# Patient Record
Sex: Female | Born: 1950 | Race: White | Hispanic: No | Smoking: Never smoker
Health system: Southern US, Community
[De-identification: ages and names within clinical notes are randomized; demographics above are authoritative.]

## PROBLEM LIST (undated history)

## (undated) DIAGNOSIS — K439 Ventral hernia without obstruction or gangrene: Secondary | ICD-10-CM

## (undated) DIAGNOSIS — D472 Monoclonal gammopathy: Secondary | ICD-10-CM

## (undated) DIAGNOSIS — C801 Malignant (primary) neoplasm, unspecified: Secondary | ICD-10-CM

## (undated) HISTORY — DX: Malignant (primary) neoplasm, unspecified: C80.1

## (undated) HISTORY — PX: HERNIA REPAIR: SHX51

## (undated) HISTORY — PX: COLONOSCOPY: SHX174

## (undated) HISTORY — DX: Monoclonal gammopathy: D47.2

## (undated) HISTORY — PX: COLECTOMY: SHX59

## (undated) HISTORY — PX: OTHER SURGICAL HISTORY: SHX169

---
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
## 1998-06-23 ENCOUNTER — Other Ambulatory Visit: Admission: RE | Admit: 1998-06-23 | Discharge: 1998-06-23 | Payer: Self-pay | Admitting: Gynecology

## 1999-07-06 ENCOUNTER — Other Ambulatory Visit: Admission: RE | Admit: 1999-07-06 | Discharge: 1999-07-06 | Payer: Self-pay | Admitting: Obstetrics and Gynecology

## 1999-09-05 ENCOUNTER — Encounter: Payer: Self-pay | Admitting: Obstetrics and Gynecology

## 1999-09-05 ENCOUNTER — Encounter: Admission: RE | Admit: 1999-09-05 | Discharge: 1999-09-05 | Payer: Self-pay | Admitting: Obstetrics and Gynecology

## 2000-07-24 ENCOUNTER — Other Ambulatory Visit: Admission: RE | Admit: 2000-07-24 | Discharge: 2000-07-24 | Payer: Self-pay | Admitting: Obstetrics and Gynecology

## 2000-09-05 ENCOUNTER — Encounter: Admission: RE | Admit: 2000-09-05 | Discharge: 2000-09-05 | Payer: Self-pay | Admitting: Obstetrics and Gynecology

## 2000-09-05 ENCOUNTER — Encounter: Payer: Self-pay | Admitting: Obstetrics and Gynecology

## 2001-09-01 ENCOUNTER — Other Ambulatory Visit: Admission: RE | Admit: 2001-09-01 | Discharge: 2001-09-01 | Payer: Self-pay | Admitting: Obstetrics and Gynecology

## 2001-09-05 ENCOUNTER — Encounter: Payer: Self-pay | Admitting: Obstetrics and Gynecology

## 2001-09-05 ENCOUNTER — Encounter: Admission: RE | Admit: 2001-09-05 | Discharge: 2001-09-05 | Payer: Self-pay | Admitting: Obstetrics and Gynecology

## 2002-09-07 ENCOUNTER — Encounter: Payer: Self-pay | Admitting: Obstetrics and Gynecology

## 2002-09-07 ENCOUNTER — Encounter: Admission: RE | Admit: 2002-09-07 | Discharge: 2002-09-07 | Payer: Self-pay | Admitting: Obstetrics and Gynecology

## 2002-09-16 ENCOUNTER — Other Ambulatory Visit: Admission: RE | Admit: 2002-09-16 | Discharge: 2002-09-16 | Payer: Self-pay | Admitting: Obstetrics and Gynecology

## 2003-09-21 ENCOUNTER — Encounter: Admission: RE | Admit: 2003-09-21 | Discharge: 2003-09-21 | Payer: Self-pay | Admitting: Obstetrics and Gynecology

## 2003-12-15 ENCOUNTER — Other Ambulatory Visit: Admission: RE | Admit: 2003-12-15 | Discharge: 2003-12-15 | Payer: Self-pay | Admitting: Obstetrics and Gynecology

## 2004-09-21 ENCOUNTER — Encounter: Admission: RE | Admit: 2004-09-21 | Discharge: 2004-09-21 | Payer: Self-pay | Admitting: Obstetrics and Gynecology

## 2005-01-24 ENCOUNTER — Other Ambulatory Visit: Admission: RE | Admit: 2005-01-24 | Discharge: 2005-01-24 | Payer: Self-pay | Admitting: Obstetrics and Gynecology

## 2005-06-11 ENCOUNTER — Ambulatory Visit (HOSPITAL_COMMUNITY): Admission: RE | Admit: 2005-06-11 | Discharge: 2005-06-11 | Payer: Self-pay | Admitting: Neurosurgery

## 2005-11-06 ENCOUNTER — Encounter: Admission: RE | Admit: 2005-11-06 | Discharge: 2005-11-06 | Payer: Self-pay | Admitting: Obstetrics and Gynecology

## 2005-11-21 ENCOUNTER — Encounter: Admission: RE | Admit: 2005-11-21 | Discharge: 2006-02-19 | Payer: Self-pay | Admitting: Specialist

## 2006-07-31 ENCOUNTER — Other Ambulatory Visit: Admission: RE | Admit: 2006-07-31 | Discharge: 2006-07-31 | Payer: Self-pay | Admitting: Obstetrics and Gynecology

## 2006-12-03 ENCOUNTER — Encounter: Admission: RE | Admit: 2006-12-03 | Discharge: 2006-12-03 | Payer: Self-pay | Admitting: Obstetrics and Gynecology

## 2007-12-08 ENCOUNTER — Encounter: Admission: RE | Admit: 2007-12-08 | Discharge: 2007-12-08 | Payer: Self-pay | Admitting: Obstetrics and Gynecology

## 2008-12-14 ENCOUNTER — Encounter: Admission: RE | Admit: 2008-12-14 | Discharge: 2008-12-14 | Payer: Self-pay | Admitting: Obstetrics and Gynecology

## 2009-07-15 ENCOUNTER — Encounter: Admission: RE | Admit: 2009-07-15 | Discharge: 2009-07-15 | Payer: Self-pay | Admitting: Internal Medicine

## 2009-08-12 ENCOUNTER — Encounter: Admission: RE | Admit: 2009-08-12 | Discharge: 2009-08-12 | Payer: Self-pay | Admitting: Neurological Surgery

## 2009-11-10 ENCOUNTER — Inpatient Hospital Stay (HOSPITAL_COMMUNITY): Admission: RE | Admit: 2009-11-10 | Discharge: 2009-11-15 | Payer: Self-pay | Admitting: General Surgery

## 2009-11-10 ENCOUNTER — Encounter (INDEPENDENT_AMBULATORY_CARE_PROVIDER_SITE_OTHER): Payer: Self-pay | Admitting: General Surgery

## 2009-12-19 ENCOUNTER — Encounter: Admission: RE | Admit: 2009-12-19 | Discharge: 2009-12-19 | Payer: Self-pay | Admitting: Obstetrics and Gynecology

## 2010-04-07 ENCOUNTER — Encounter (HOSPITAL_COMMUNITY): Admission: RE | Admit: 2010-04-07 | Discharge: 2010-05-07 | Payer: Self-pay | Admitting: Oncology

## 2010-04-10 ENCOUNTER — Ambulatory Visit (HOSPITAL_COMMUNITY): Payer: Self-pay | Admitting: Oncology

## 2010-05-19 ENCOUNTER — Encounter (HOSPITAL_COMMUNITY): Admission: RE | Admit: 2010-05-19 | Discharge: 2010-06-18 | Payer: Self-pay | Admitting: Oncology

## 2010-06-13 ENCOUNTER — Ambulatory Visit (HOSPITAL_COMMUNITY): Payer: Self-pay | Admitting: Oncology

## 2010-10-03 ENCOUNTER — Encounter (HOSPITAL_COMMUNITY)
Admission: RE | Admit: 2010-10-03 | Discharge: 2010-11-02 | Payer: Self-pay | Source: Home / Self Care | Attending: Oncology | Admitting: Oncology

## 2010-10-03 ENCOUNTER — Ambulatory Visit (HOSPITAL_COMMUNITY): Payer: Self-pay | Admitting: Oncology

## 2010-11-25 ENCOUNTER — Other Ambulatory Visit: Payer: Self-pay | Admitting: Obstetrics and Gynecology

## 2010-11-25 DIAGNOSIS — Z1239 Encounter for other screening for malignant neoplasm of breast: Secondary | ICD-10-CM

## 2011-01-01 ENCOUNTER — Ambulatory Visit
Admission: RE | Admit: 2011-01-01 | Discharge: 2011-01-01 | Disposition: A | Discharge status: Home / Self Care | Payer: BC Managed Care – PPO | Source: Ambulatory Visit | Attending: Obstetrics and Gynecology | Admitting: Obstetrics and Gynecology

## 2011-01-01 DIAGNOSIS — Z1239 Encounter for other screening for malignant neoplasm of breast: Secondary | ICD-10-CM

## 2011-01-02 ENCOUNTER — Encounter (HOSPITAL_COMMUNITY): Payer: BC Managed Care – PPO | Attending: Oncology

## 2011-01-02 ENCOUNTER — Other Ambulatory Visit (HOSPITAL_COMMUNITY): Payer: BC Managed Care – PPO

## 2011-01-02 DIAGNOSIS — D472 Monoclonal gammopathy: Secondary | ICD-10-CM

## 2011-01-02 DIAGNOSIS — C8800 Waldenstrom macroglobulinemia not having achieved remission: Secondary | ICD-10-CM | POA: Insufficient documentation

## 2011-01-12 ENCOUNTER — Ambulatory Visit (HOSPITAL_COMMUNITY): Payer: BC Managed Care – PPO | Admitting: Oncology

## 2011-01-12 DIAGNOSIS — E8809 Other disorders of plasma-protein metabolism, not elsewhere classified: Secondary | ICD-10-CM

## 2011-01-16 LAB — DIFFERENTIAL
Eosinophils Absolute: 0.1 10*3/uL (ref 0.0–0.7)
Eosinophils Relative: 2 % (ref 0–5)
Lymphocytes Relative: 45 % (ref 12–46)
Lymphs Abs: 3.6 10*3/uL (ref 0.7–4.0)
Monocytes Relative: 7 % (ref 3–12)
Neutro Abs: 3.7 10*3/uL (ref 1.7–7.7)

## 2011-01-16 LAB — IMMUNOFIXATION ELECTROPHORESIS: IgG (Immunoglobin G), Serum: 1060 mg/dL (ref 694–1618)

## 2011-01-16 LAB — PROTEIN ELECTROPHORESIS, SERUM
Albumin ELP: 45.7 % — ABNORMAL LOW (ref 55.8–66.1)
Alpha-2-Globulin: 8.6 % (ref 7.1–11.8)
Beta 2: 4.4 % (ref 3.2–6.5)
Beta Globulin: 3.8 % — ABNORMAL LOW (ref 4.7–7.2)

## 2011-01-16 LAB — COMPREHENSIVE METABOLIC PANEL
ALT: 16 U/L (ref 0–35)
Albumin: 3.9 g/dL (ref 3.5–5.2)
BUN: 17 mg/dL (ref 6–23)
Creatinine, Ser: 0.93 mg/dL (ref 0.4–1.2)
Total Protein: 9.3 g/dL — ABNORMAL HIGH (ref 6.0–8.3)

## 2011-01-16 LAB — KAPPA/LAMBDA LIGHT CHAINS: Lambda free light chains: 63.7 mg/dL — ABNORMAL HIGH (ref 0.57–2.63)

## 2011-01-16 LAB — CBC
Hemoglobin: 11.8 g/dL — ABNORMAL LOW (ref 12.0–15.0)
MCHC: 33.6 g/dL (ref 30.0–36.0)
RDW: 13.4 % (ref 11.5–15.5)

## 2011-01-19 LAB — CREATININE, SERUM: Creatinine, Ser: 0.74 mg/dL (ref 0.4–1.2)

## 2011-01-20 LAB — DIFFERENTIAL
Eosinophils Absolute: 0.1 10*3/uL (ref 0.0–0.7)
Lymphs Abs: 2.7 10*3/uL (ref 0.7–4.0)
Neutro Abs: 2.9 10*3/uL (ref 1.7–7.7)
Neutrophils Relative %: 46 % (ref 43–77)

## 2011-01-20 LAB — BONE MARROW EXAM

## 2011-01-20 LAB — TISSUE HYBRIDIZATION (BONE MARROW)-NCBH

## 2011-01-20 LAB — CBC
MCH: 31.2 pg (ref 26.0–34.0)
Platelets: 230 10*3/uL (ref 150–400)
RBC: 3.7 MIL/uL — ABNORMAL LOW (ref 3.87–5.11)
WBC: 6.4 10*3/uL (ref 4.0–10.5)

## 2011-01-20 LAB — CHROMOSOME ANALYSIS, BONE MARROW

## 2011-01-21 LAB — CBC
HCT: 28.3 % — ABNORMAL LOW (ref 36.0–46.0)
Hemoglobin: 11.7 g/dL — ABNORMAL LOW (ref 12.0–15.0)
Hemoglobin: 9.7 g/dL — ABNORMAL LOW (ref 12.0–15.0)
MCHC: 34.2 g/dL (ref 30.0–36.0)
MCV: 92.4 fL (ref 78.0–100.0)
RBC: 3.73 MIL/uL — ABNORMAL LOW (ref 3.87–5.11)
RDW: 13.2 % (ref 11.5–15.5)
WBC: 7.7 10*3/uL (ref 4.0–10.5)

## 2011-01-21 LAB — BASIC METABOLIC PANEL
CO2: 28 mEq/L (ref 19–32)
GFR calc non Af Amer: >60 mL/min (ref >60)
Glucose, Bld: 143 mg/dL — ABNORMAL HIGH (ref 70–99)
Potassium: 3.8 mEq/L (ref 3.5–5.1)
Sodium: 135 mEq/L (ref 135–145)

## 2011-01-21 LAB — COMPREHENSIVE METABOLIC PANEL
ALT: 65 U/L — ABNORMAL HIGH (ref 0–35)
AST: 30 U/L (ref 0–37)
CO2: 31 mEq/L (ref 19–32)
Chloride: 102 mEq/L (ref 96–112)
Creatinine, Ser: 0.7 mg/dL (ref 0.4–1.2)
GFR calc Af Amer: >60 mL/min (ref >60)
GFR calc non Af Amer: >60 mL/min (ref >60)
Sodium: 139 mEq/L (ref 135–145)
Total Bilirubin: 0.6 mg/dL (ref 0.3–1.2)

## 2011-01-21 LAB — DIFFERENTIAL
Basophils Absolute: 0 10*3/uL (ref 0.0–0.1)
Basophils Relative: 1 % (ref 0–1)
Eosinophils Absolute: 0.1 10*3/uL (ref 0.0–0.7)
Eosinophils Relative: 2 % (ref 0–5)

## 2011-01-21 LAB — PROTIME-INR: Prothrombin Time: 12.9 seconds (ref 11.6–15.2)

## 2011-01-22 LAB — COMPREHENSIVE METABOLIC PANEL
Alkaline Phosphatase: 83 U/L (ref 39–117)
BUN: 16 mg/dL (ref 6–23)
CO2: 29 mEq/L (ref 19–32)
Chloride: 103 mEq/L (ref 96–112)
Creatinine, Ser: 0.82 mg/dL (ref 0.4–1.2)
GFR calc non Af Amer: >60 mL/min (ref >60)
Glucose, Bld: 78 mg/dL (ref 70–99)
Total Bilirubin: 0.5 mg/dL (ref 0.3–1.2)

## 2011-01-22 LAB — HEPATITIS PANEL, ACUTE
HCV Ab: NEGATIVE
Hep A IgM: NEGATIVE

## 2011-01-22 LAB — IRON AND TIBC
Iron: 54 ug/dL (ref 42–135)
TIBC: 244 ug/dL — ABNORMAL LOW (ref 250–470)

## 2011-01-22 LAB — PROTEIN, URINE, 24 HOUR
Protein, Urine: <6 mg/dL
Urine Total Volume-UPROT: 1550 mL

## 2011-01-22 LAB — STREP A DNA PROBE: Group A Strep Probe: NEGATIVE

## 2011-01-22 LAB — CBC
HCT: 33.6 % — ABNORMAL LOW (ref 36.0–46.0)
Hemoglobin: 11.3 g/dL — ABNORMAL LOW (ref 12.0–15.0)
MCV: 92.2 fL (ref 78.0–100.0)
Platelets: 232 10*3/uL (ref 150–400)
RBC: 3.65 MIL/uL — ABNORMAL LOW (ref 3.87–5.11)
WBC: 8 10*3/uL (ref 4.0–10.5)

## 2011-01-22 LAB — CREATININE, SERUM: Creatinine, Ser: 0.8 mg/dL (ref 0.4–1.2)

## 2011-01-22 LAB — KAPPA/LAMBDA LIGHT CHAINS
Kappa, lambda light chain ratio: 0.02 — ABNORMAL LOW (ref 0.26–1.65)
Lambda free light chains: 34.4 mg/dL — ABNORMAL HIGH (ref 0.57–2.63)

## 2011-01-22 LAB — DIFFERENTIAL
Basophils Absolute: 0 10*3/uL (ref 0.0–0.1)
Basophils Relative: 1 % (ref 0–1)
Lymphocytes Relative: 42 % (ref 12–46)
Neutro Abs: 3.8 10*3/uL (ref 1.7–7.7)
Neutrophils Relative %: 48 % (ref 43–77)

## 2011-01-22 LAB — CREATININE CLEARANCE, URINE, 24 HOUR
Collection Interval-CRCL: 24 hours
Creatinine, 24H Ur: 1191 mg/d (ref 700–1800)
Urine Total Volume-CRCL: 1550 mL

## 2011-01-22 LAB — RETICULOCYTES
RBC.: 3.65 MIL/uL — ABNORMAL LOW (ref 3.87–5.11)
Retic Ct Pct: 1.4 % (ref 0.4–3.1)

## 2011-01-22 LAB — IMMUNOFIXATION ELECTROPHORESIS: IgA: 96 mg/dL (ref 68–378)

## 2011-03-23 NOTE — H&P (Signed)
[name], [name]          ACCOUNT NO.:  [id number]   MEDICAL RECORD NO.:  [id number]          PATIENT TYPE:  OIB   LOCATION:  2854                         FACILITY:  [facility]   PHYSICIAN:  Sanjeev K. Deveshwar, M.D.DATE OF BIRTH:  Jun 25, 1951   DATE OF ADMISSION:  06/11/2005  DATE OF DISCHARGE:                                HISTORY & PHYSICAL   CHIEF COMPLAINT:  The patient is here for a cerebral angiogram.   HISTORY OF PRESENT ILLNESS:  This is a 60 year old female with a history of  migraine headaches.  She was recently evaluated by Dr. Santiago Glad for  a severe headache on May 11, 2005.  MRI/MRA was ordered and performed on  May 23, 2005.  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  The patient was referred to Dr. Franky Macho for further  evaluation.  Dr. Franky Macho recommended a cerebral angiogram to rule out a  possible aneurysm as seen on the MRA.  The patient reports that she has been  doing fine since that time except she has had a nonstop headache.   PAST MEDICAL HISTORY:  Essentially negative except for a history of migraine  headaches.   PAST SURGICAL HISTORY:  The patient is status post appendectomy in 1969.   ALLERGIES:  No known drug allergies.  She has never had contrast.   CURRENT MEDICATIONS:  1.  Lexapro 10 mg daily.  2.  Topamax 50 mg daily.  3.  Tramadol p.r.n.   SOCIAL HISTORY:  The patient is widowed.  She has no children.  She lives in  [place] alone.  She does not use tobacco.  She uses alcohol socially.  She works in Barrister's clerk at [employer].   FAMILY HISTORY:  The patient's mother died at age 19 from myasthenia gravis.  Her father died at age 11 from a brain tumor.   REVIEW OF SYSTEMS:  Completely negative except for the above noted  headaches.   PHYSICAL EXAMINATION:  GENERAL:  Pleasant 60 year old white female in no  acute distress.  VITAL SIGNS:  Blood pressure 107/68, pulse 78, respirations 16,  temperature  97.3.  HEENT:  Unremarkable.  NECK:  No bruits.  No jugular venous distention.  HEART:  Regular rate and rhythm without murmur.  LUNGS:  Clear.  ABDOMEN:  Soft, nontender.  EXTREMITIES:  Weak, but intact.  SKIN:  Warm and dry.  NEUROLOGIC:  Mental status:  The patient was alert and oriented and follows  commands.  Cranial nerves II-XII are grossly intact.  Sensation is intact to  light touch.  Motor strength is 5/5 throughout.  Cerebellar testing is  intact.  Her airway was rated at a 2.  Her ASA scale was rated at a 1.   IMPRESSION:  1.  Migraine headaches.  2.  Recent severe headache on July 7.  3.  MRA May 23, 2005 questioning a 2-3 mm right middle cerebral artery      aneurysm.   PLAN:  The patient will undergo a cerebral angiogram today to further  evaluate for a possible aneurysm.  Risks and benefits were  discussed in  depth with the patient and she agrees to proceed.   ADMITTING DIAGNOSES:  Laboratory data:  An INR was 1, PTT was 33.  CBC  revealed hemoglobin 12.3, hematocrit 35.9, platelets 285,000.  BUN was 18,  creatinine 0.8, potassium 3.6, glucose 93.      Markus.Osmond   DR/MEDQ  D:  06/11/2005  T:  06/11/2005  Job:  [id number]   cc:   Theressa Millard, M.D.  [address] [place]  [place] [address]  Fax: [phone]   Santiago Glad  [address]  [place]  [place] [address]  Fax: [phone]

## 2011-04-17 ENCOUNTER — Encounter (HOSPITAL_COMMUNITY): Payer: Self-pay | Admitting: *Deleted

## 2011-04-23 ENCOUNTER — Encounter (HOSPITAL_COMMUNITY): Payer: Self-pay | Admitting: Oncology

## 2011-04-23 ENCOUNTER — Other Ambulatory Visit (HOSPITAL_COMMUNITY): Payer: Self-pay | Admitting: Oncology

## 2011-04-23 DIAGNOSIS — D472 Monoclonal gammopathy: Secondary | ICD-10-CM

## 2011-04-23 HISTORY — DX: Monoclonal gammopathy: D47.2

## 2011-05-14 ENCOUNTER — Encounter (HOSPITAL_COMMUNITY): Payer: Self-pay

## 2011-05-15 ENCOUNTER — Other Ambulatory Visit (HOSPITAL_COMMUNITY): Payer: Self-pay | Admitting: Oncology

## 2011-05-15 ENCOUNTER — Encounter (HOSPITAL_COMMUNITY): Payer: BC Managed Care – PPO | Attending: Oncology | Admitting: Oncology

## 2011-05-15 ENCOUNTER — Ambulatory Visit (HOSPITAL_COMMUNITY)
Admission: RE | Admit: 2011-05-15 | Discharge: 2011-05-15 | Disposition: A | Discharge status: Home / Self Care | Payer: BC Managed Care – PPO | Source: Ambulatory Visit | Attending: Oncology | Admitting: Oncology

## 2011-05-15 VITALS — BP 94/61 | HR 54 | Temp 97.6°F

## 2011-05-15 DIAGNOSIS — C8800 Waldenstrom macroglobulinemia not having achieved remission: Secondary | ICD-10-CM | POA: Insufficient documentation

## 2011-05-15 DIAGNOSIS — C9000 Multiple myeloma not having achieved remission: Secondary | ICD-10-CM

## 2011-05-15 DIAGNOSIS — D472 Monoclonal gammopathy: Secondary | ICD-10-CM | POA: Insufficient documentation

## 2011-05-15 LAB — COMPREHENSIVE METABOLIC PANEL
ALT: 8 U/L (ref 0–35)
Alkaline Phosphatase: 73 U/L (ref 39–117)
CO2: 28 mEq/L (ref 19–32)
Calcium: 9.5 mg/dL (ref 8.4–10.5)
Chloride: 105 mEq/L (ref 96–112)
GFR calc Af Amer: >60 mL/min (ref >60)
GFR calc non Af Amer: >60 mL/min (ref >60)
Glucose, Bld: 98 mg/dL (ref 70–99)
Potassium: 4.2 mEq/L (ref 3.5–5.1)
Sodium: 140 mEq/L (ref 135–145)
Total Bilirubin: 0.4 mg/dL (ref 0.3–1.2)

## 2011-05-15 LAB — CBC
Hemoglobin: 10.7 g/dL — ABNORMAL LOW (ref 12.0–15.0)
MCH: 31 pg (ref 26.0–34.0)
MCV: 93.3 fL (ref 78.0–100.0)
RBC: 3.45 MIL/uL — ABNORMAL LOW (ref 3.87–5.11)

## 2011-05-15 LAB — DIFFERENTIAL
Eosinophils Absolute: 0.1 10*3/uL (ref 0.0–0.7)
Lymphs Abs: 2.7 10*3/uL (ref 0.7–4.0)
Monocytes Relative: 8 % (ref 3–12)
Neutrophils Relative %: 45 % (ref 43–77)

## 2011-05-15 LAB — PROTEIN, URINE, 24 HOUR
Protein, 24H Urine: 105 mg/d — ABNORMAL HIGH (ref 50–100)
Protein, Urine: 11 mg/dL
Urine Total Volume-UPROT: 950 mL

## 2011-05-15 NOTE — Progress Notes (Signed)
PROCEDURE:  Bone marrow aspirate and biopsy from the right posterior superior iliac spinous process.  DIAGNOSIS:  Monoclonal gammopathy of undetermined significance versus developing IgM myeloma versus Waldenstrom's macroglobulinemia.  Lindsay Cook was brought to the clinic by her sister-in-law.  She was alert.  She was oriented.  Her vital signs showed her blood pressure to be stable.  Pulse to be right around 60 and regular.  She was warm and dry to the touch.  She was here for a bone marrow aspirate and biopsy and seemed to understand the procedure completely.  She had taken her premeds approximately an hour before arrival.  She was placed in the prone position.  After making sure she did not have any questions about the procedure.  We identified both posterior superior iliac spinous processes and chose the right.  She was then cleansed with 3 Betadine swabs and anesthetized with 9 cc of 2% plain Xylocaine.  She had an aspirate done without difficulty for both routine_H and E  as well as cytogenetics and flow cytometry and then she had a routine bone marrow biopsy done without incident and she was in intact shape when the procedure was finished with no complications.  Labs were then drawn from her peripheral vein and sent for followup evaluation.    ______________________________ [name]. [name], MD ESN/MEDQ  D:  05/15/2011  T:  05/15/2011  Job:  [id number]

## 2011-05-15 NOTE — Progress Notes (Signed)
Patient arrived and verbalized understanding of procedure. Xanax 0.5mg  and Hydrocodone 10/325 taken by pt at 710 am at home prior to arrival. Consent signed. Positioned supine for procedure.Time-out performed.Procedure began at 0847. Xylocaine 2% 10 cc used for local. Finished at [phone]. Tolerated well.Pressure dressing applied with instructions to leave in place for 24 hrs and to report any bleeding that saturates dressing and to take pain med as indicated. Dressing dry and intact to left hip on discharge.

## 2011-05-15 NOTE — Patient Instructions (Addendum)
Today you had a bone marrow biopsy and aspirate to your left hip. Please keep the pressure dressing in place for at least 24hrs. Have someone check your dressing periodically for bleeding.  If needed you can reapply a pressure dressing to the site. If bleeding reoccurs call us [phone] or report to Emergency Dept. Take pain medication as directed.

## 2011-05-18 LAB — MULTIPLE MYELOMA PANEL, SERUM
Alpha-1-Globulin: 3.4 % (ref 2.9–4.9)
Alpha-2-Globulin: 9.2 % (ref 7.1–11.8)
Beta 2: 4.3 % (ref 3.2–6.5)
Beta Globulin: 3.8 % — ABNORMAL LOW (ref 4.7–7.2)
Gamma Globulin: 32 % — ABNORMAL HIGH (ref 11.1–18.8)
IgM, Serum: 2880 mg/dL — ABNORMAL HIGH (ref 52–322)

## 2011-07-06 ENCOUNTER — Other Ambulatory Visit (HOSPITAL_COMMUNITY): Payer: Self-pay | Admitting: Oncology

## 2011-07-06 ENCOUNTER — Encounter (HOSPITAL_COMMUNITY): Payer: BC Managed Care – PPO | Attending: Oncology

## 2011-07-06 DIAGNOSIS — C8800 Waldenstrom macroglobulinemia not having achieved remission: Secondary | ICD-10-CM | POA: Insufficient documentation

## 2011-07-06 DIAGNOSIS — D472 Monoclonal gammopathy: Secondary | ICD-10-CM | POA: Insufficient documentation

## 2011-07-06 LAB — COMPREHENSIVE METABOLIC PANEL
AST: 10 U/L (ref 0–37)
Albumin: 3.6 g/dL (ref 3.5–5.2)
BUN: 17 mg/dL (ref 6–23)
CO2: 29 mEq/L (ref 19–32)
Calcium: 9.6 mg/dL (ref 8.4–10.5)
Creatinine, Ser: 0.64 mg/dL (ref 0.50–1.10)
GFR calc non Af Amer: >60 mL/min (ref >60)
Total Bilirubin: 0.3 mg/dL (ref 0.3–1.2)

## 2011-07-06 LAB — CBC
HCT: 34.2 % — ABNORMAL LOW (ref 36.0–46.0)
MCH: 30.5 pg (ref 26.0–34.0)
MCV: 94.7 fL (ref 78.0–100.0)
Platelets: 187 10*3/uL (ref 150–400)
RDW: 13.6 % (ref 11.5–15.5)

## 2011-07-06 LAB — DIFFERENTIAL
Basophils Relative: 0 % (ref 0–1)
Eosinophils Absolute: 0.1 10*3/uL (ref 0.0–0.7)
Eosinophils Relative: 1 % (ref 0–5)
Lymphs Abs: 2.4 10*3/uL (ref 0.7–4.0)
Monocytes Relative: 8 % (ref 3–12)

## 2011-07-06 LAB — C-REACTIVE PROTEIN: CRP: 0.25 mg/dL — ABNORMAL LOW (ref <0.60)

## 2011-07-06 NOTE — Progress Notes (Signed)
Labs drawn today for cbc/diff,cmp,ldh,b4m,crp,mm panel,ifix,quat. Imm., kllc

## 2011-07-07 LAB — IGG, IGA, IGM
IgA: 69 mg/dL (ref 69–380)
IgM, Serum: 2770 mg/dL — ABNORMAL HIGH (ref 52–322)

## 2011-07-10 ENCOUNTER — Other Ambulatory Visit (HOSPITAL_COMMUNITY): Payer: Self-pay | Admitting: Oncology

## 2011-07-10 ENCOUNTER — Encounter (HOSPITAL_COMMUNITY): Payer: BC Managed Care – PPO | Attending: Oncology

## 2011-07-10 DIAGNOSIS — D472 Monoclonal gammopathy: Secondary | ICD-10-CM | POA: Insufficient documentation

## 2011-07-10 DIAGNOSIS — C8800 Waldenstrom macroglobulinemia not having achieved remission: Secondary | ICD-10-CM | POA: Insufficient documentation

## 2011-07-10 LAB — PROTEIN, URINE, 24 HOUR
Collection Interval-UPROT: 24 hours
Protein, 24H Urine: 99 mg/d (ref 50–100)
Protein, Urine: 6 mg/dL
Urine Total Volume-UPROT: 1650 mL

## 2011-07-10 LAB — CREATININE CLEARANCE, URINE, 24 HOUR: Urine Total Volume-CRCL: 1650 mL

## 2011-07-10 NOTE — Progress Notes (Signed)
Patient brought 24 urine jug for CrCl, Total protein, UFIX

## 2011-07-11 LAB — IMMUNOFIXATION ELECTROPHORESIS
IgG (Immunoglobin G), Serum: 1090 mg/dL (ref 690–1700)
Total Protein ELP: 8.2 g/dL (ref 6.0–8.3)

## 2011-07-12 LAB — MULTIPLE MYELOMA PANEL, SERUM
Albumin ELP: 46.4 % — ABNORMAL LOW (ref 55.8–66.1)
Alpha-2-Globulin: 9.4 % (ref 7.1–11.8)
Beta 2: 4.1 % (ref 3.2–6.5)
Beta Globulin: 4 % — ABNORMAL LOW (ref 4.7–7.2)
IgA: 75 mg/dL (ref 69–380)
IgM, Serum: 2940 mg/dL — ABNORMAL HIGH (ref 52–322)
Total Protein: 8.4 g/dL — ABNORMAL HIGH (ref 6.0–8.3)

## 2011-07-12 LAB — IMMUNOFIXATION, URINE

## 2011-07-13 ENCOUNTER — Telehealth (HOSPITAL_COMMUNITY): Payer: Self-pay

## 2011-07-13 NOTE — Telephone Encounter (Signed)
Patient requested that labs be sent via mail to home.  All labs mailed today.

## 2011-07-27 ENCOUNTER — Other Ambulatory Visit (HOSPITAL_COMMUNITY): Payer: Self-pay | Admitting: Oncology

## 2011-07-27 DIAGNOSIS — C9000 Multiple myeloma not having achieved remission: Secondary | ICD-10-CM

## 2011-07-30 NOTE — Progress Notes (Signed)
Lindsay Cook, this is what I tried to send you.

## 2011-08-06 ENCOUNTER — Ambulatory Visit
Admission: RE | Admit: 2011-08-06 | Discharge: 2011-08-06 | Disposition: A | Discharge status: Home / Self Care | Payer: BC Managed Care – PPO | Source: Ambulatory Visit | Attending: Oncology | Admitting: Oncology

## 2011-08-06 DIAGNOSIS — C9000 Multiple myeloma not having achieved remission: Secondary | ICD-10-CM

## 2011-08-06 DIAGNOSIS — D472 Monoclonal gammopathy: Secondary | ICD-10-CM

## 2011-08-07 ENCOUNTER — Other Ambulatory Visit (HOSPITAL_COMMUNITY): Payer: Self-pay | Admitting: Oncology

## 2011-08-07 DIAGNOSIS — D472 Monoclonal gammopathy: Secondary | ICD-10-CM

## 2011-08-09 ENCOUNTER — Other Ambulatory Visit (HOSPITAL_COMMUNITY): Payer: BC Managed Care – PPO

## 2011-08-14 ENCOUNTER — Ambulatory Visit (HOSPITAL_COMMUNITY): Payer: BC Managed Care – PPO | Admitting: Oncology

## 2011-10-05 ENCOUNTER — Encounter (HOSPITAL_COMMUNITY): Payer: BC Managed Care – PPO | Attending: Oncology

## 2011-10-05 DIAGNOSIS — D472 Monoclonal gammopathy: Secondary | ICD-10-CM | POA: Insufficient documentation

## 2011-10-05 LAB — DIFFERENTIAL
Eosinophils Relative: 1 % (ref 0–5)
Lymphocytes Relative: 43 % (ref 12–46)
Lymphs Abs: 2.7 10*3/uL (ref 0.7–4.0)
Monocytes Relative: 9 % (ref 3–12)
Neutrophils Relative %: 47 % (ref 43–77)

## 2011-10-05 LAB — CBC
Hemoglobin: 11.3 g/dL — ABNORMAL LOW (ref 12.0–15.0)
MCV: 95.4 fL (ref 78.0–100.0)
Platelets: 198 10*3/uL (ref 150–400)
RBC: 3.66 MIL/uL — ABNORMAL LOW (ref 3.87–5.11)
WBC: 6.4 10*3/uL (ref 4.0–10.5)

## 2011-10-05 NOTE — Progress Notes (Signed)
Lindsay Cook presented for labwork. Labs per MD order drawn via Peripheral Line 25 gauge needle inserted in rt arm.  Good blood return present. Procedure without incident.  Needle removed intact. Patient tolerated procedure well.

## 2011-10-08 LAB — KAPPA/LAMBDA LIGHT CHAINS
Kappa free light chain: 0.76 mg/dL (ref 0.33–1.94)
Lambda free light chains: 10.5 mg/dL — ABNORMAL HIGH (ref 0.57–2.63)

## 2011-10-11 LAB — MULTIPLE MYELOMA PANEL, SERUM
Albumin ELP: 47.7 % — ABNORMAL LOW (ref 55.8–66.1)
Alpha-2-Globulin: 9.3 % (ref 7.1–11.8)
Beta 2: 4 % (ref 3.2–6.5)
Beta Globulin: 3.9 % — ABNORMAL LOW (ref 4.7–7.2)
IgA: 84 mg/dL (ref 69–380)
IgM, Serum: 3300 mg/dL — ABNORMAL HIGH (ref 52–322)
Total Protein: 9.1 g/dL — ABNORMAL HIGH (ref 6.0–8.3)

## 2011-10-31 ENCOUNTER — Encounter: Payer: Self-pay | Admitting: Oncology

## 2011-11-14 ENCOUNTER — Other Ambulatory Visit (HOSPITAL_COMMUNITY): Payer: Self-pay | Admitting: Oncology

## 2011-11-14 DIAGNOSIS — M549 Dorsalgia, unspecified: Secondary | ICD-10-CM

## 2011-11-14 DIAGNOSIS — D472 Monoclonal gammopathy: Secondary | ICD-10-CM

## 2011-11-14 MED ORDER — CYCLOBENZAPRINE HCL 10 MG PO TABS: 10.0000 mg | ORAL_TABLET | Freq: Three times a day (TID) | ORAL | Status: AC | PRN

## 2011-11-14 MED ORDER — HYDROCODONE-ACETAMINOPHEN 5-500 MG PO CAPS: 1.0000 | ORAL_CAPSULE | Freq: Four times a day (QID) | ORAL | Status: AC | PRN

## 2011-11-29 ENCOUNTER — Other Ambulatory Visit: Payer: Self-pay | Admitting: Obstetrics and Gynecology

## 2011-11-29 DIAGNOSIS — Z1231 Encounter for screening mammogram for malignant neoplasm of breast: Secondary | ICD-10-CM

## 2011-12-31 ENCOUNTER — Other Ambulatory Visit (HOSPITAL_COMMUNITY): Payer: Self-pay

## 2012-01-01 ENCOUNTER — Encounter (HOSPITAL_COMMUNITY): Payer: BC Managed Care – PPO | Attending: Oncology

## 2012-01-01 DIAGNOSIS — D472 Monoclonal gammopathy: Secondary | ICD-10-CM

## 2012-01-01 LAB — CBC
HCT: 34 % — ABNORMAL LOW (ref 36.0–46.0)
Hemoglobin: 11.3 g/dL — ABNORMAL LOW (ref 12.0–15.0)
MCH: 31.3 pg (ref 26.0–34.0)
MCHC: 33.2 g/dL (ref 30.0–36.0)
MCV: 94.2 fL (ref 78.0–100.0)
Platelets: 182 K/uL (ref 150–400)
RBC: 3.61 MIL/uL — ABNORMAL LOW (ref 3.87–5.11)
RDW: 13.3 % (ref 11.5–15.5)
WBC: 7 K/uL (ref 4.0–10.5)

## 2012-01-01 LAB — COMPREHENSIVE METABOLIC PANEL
ALT: 9 U/L (ref 0–35)
Calcium: 10 mg/dL (ref 8.4–10.5)
GFR calc Af Amer: >90 mL/min (ref >90)
Glucose, Bld: 115 mg/dL — ABNORMAL HIGH (ref 70–99)
Sodium: 138 mEq/L (ref 135–145)
Total Protein: 8.9 g/dL — ABNORMAL HIGH (ref 6.0–8.3)

## 2012-01-01 LAB — DIFFERENTIAL
Basophils Absolute: 0 10*3/uL (ref 0.0–0.1)
Basophils Relative: 0 % (ref 0–1)
Eosinophils Absolute: 0 10*3/uL (ref 0.0–0.7)
Eosinophils Relative: 1 % (ref 0–5)

## 2012-01-01 LAB — PROTEIN, URINE, 24 HOUR: Protein, 24H Urine: 134 mg/d — ABNORMAL HIGH (ref 50–100)

## 2012-01-02 LAB — KAPPA/LAMBDA LIGHT CHAINS: Kappa free light chain: 0.72 mg/dL (ref 0.33–1.94)

## 2012-01-03 ENCOUNTER — Ambulatory Visit
Admission: RE | Admit: 2012-01-03 | Discharge: 2012-01-03 | Disposition: A | Discharge status: Home / Self Care | Payer: BC Managed Care – PPO | Source: Ambulatory Visit | Attending: Obstetrics and Gynecology | Admitting: Obstetrics and Gynecology

## 2012-01-03 DIAGNOSIS — Z1231 Encounter for screening mammogram for malignant neoplasm of breast: Secondary | ICD-10-CM

## 2012-01-03 LAB — MULTIPLE MYELOMA PANEL, SERUM
Alpha-1-Globulin: 3.6 % (ref 2.9–4.9)
Alpha-2-Globulin: 9.5 % (ref 7.1–11.8)
Gamma Globulin: 32.7 % — ABNORMAL HIGH (ref 11.1–18.8)
IgA: 78 mg/dL (ref 69–380)
IgG (Immunoglobin G), Serum: 1160 mg/dL (ref 690–1700)

## 2012-01-05 LAB — VISCOSITY, SERUM: Viscosity, Serum: 2.4 rel to H2O — ABNORMAL HIGH (ref 1.5–1.9)

## 2012-01-07 ENCOUNTER — Encounter (HOSPITAL_COMMUNITY): Payer: BC Managed Care – PPO | Attending: Oncology | Admitting: Oncology

## 2012-01-07 VITALS — BP 100/68 | HR 74 | Temp 97.5°F | Wt 144.0 lb

## 2012-01-07 DIAGNOSIS — D649 Anemia, unspecified: Secondary | ICD-10-CM

## 2012-01-07 DIAGNOSIS — D472 Monoclonal gammopathy: Secondary | ICD-10-CM | POA: Insufficient documentation

## 2012-01-07 NOTE — Patient Instructions (Signed)
[facility]  Discharge Instructions  RECOMMENDATIONS MADE BY THE CONSULTANT AND ANY TEST RESULTS WILL BE SENT TO YOUR REFERRING DOCTOR.   SPECIAL INSTRUCTIONS/FOLLOW-UP: Lab work Needed and Return to Clinic in three months.  See the front desk for appointments before leaving.   I acknowledge that I have been informed and understand all the instructions given to me and received a copy. I do not have any more questions at this time, but understand that I may call the Specialty Clinic at [facility] at [phone] during business hours should I have any further questions or need assistance in obtaining follow-up care.    __________________________________________  _____________  __________ Signature of Patient or Authorized Representative            Date                   Time    __________________________________________ Nurse's Signature

## 2012-01-07 NOTE — Progress Notes (Signed)
CC:   Lindsay Cook, M.D. [name], MD [name]. [name], M.D.  DIAGNOSIS: 1. 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 2. Excessive weight though she is down to 144 pounds by going to     [employer]. 3. Minimal anemia.  Lindsay Cook is here today to go over her lab work which remains very stable. Her hemoglobin is 11.3 which is essentially where if we started a couple of years ago with her lab laboratory evaluation.  That was in November 2010 when she was also 11.3.  White count and platelets remain normal. Her serum viscosity was 2.4 the other day.  Lambda free light chains, of course, at one time were 63.7 mg/dL and they have leveled off at the 9.5 to 10.5 range.  Her calcium remains normal.  BUN and creatinine remain normal.  Total protein, however, is slightly high at 9.0 to 8.9; that is not new or different either.  Her urine protein is still only 134 mg per 24 hours. Her M-spike is 2250 mg which is very stable for her.  When I first saw her in June 2011, her M-spike was 2500 mg/dL.  So, she has not changed in that regard either.  She is doing well.  We will keep an eye on her with another 56-month interval for laboratory work.  I suspect we should consider at the end of this year, in October or November, repeat MRI to see if any lytic lesions.  She has asked me to send this blood work to Dr. [name] and I will do that.  She continues to lose weight and watches her diet very closely.  We will see her sooner if need be.    ______________________________ [name]. [name], MD ESN/MEDQ  D:  01/07/2012  T:  01/07/2012  Job:  [id number]

## 2012-01-22 ENCOUNTER — Encounter: Payer: Self-pay | Admitting: Oncology

## 2012-02-22 NOTE — Progress Notes (Signed)
Labs drawn

## 2012-03-25 ENCOUNTER — Encounter (HOSPITAL_COMMUNITY): Payer: BC Managed Care – PPO | Attending: Oncology

## 2012-03-25 DIAGNOSIS — D472 Monoclonal gammopathy: Secondary | ICD-10-CM | POA: Insufficient documentation

## 2012-03-25 LAB — DIFFERENTIAL
Eosinophils Absolute: 0.1 10*3/uL (ref 0.0–0.7)
Eosinophils Relative: 1 % (ref 0–5)
Lymphs Abs: 2.5 10*3/uL (ref 0.7–4.0)
Monocytes Relative: 7 % (ref 3–12)

## 2012-03-25 LAB — COMPREHENSIVE METABOLIC PANEL
ALT: 8 U/L (ref 0–35)
AST: 17 U/L (ref 0–37)
Alkaline Phosphatase: 69 U/L (ref 39–117)
CO2: 29 mEq/L (ref 19–32)
Calcium: 10.2 mg/dL (ref 8.4–10.5)
Chloride: 101 mEq/L (ref 96–112)
GFR calc non Af Amer: 89 mL/min — ABNORMAL LOW (ref >90)
Glucose, Bld: 86 mg/dL (ref 70–99)
Potassium: 3.9 mEq/L (ref 3.5–5.1)
Sodium: 138 mEq/L (ref 135–145)
Total Bilirubin: 0.3 mg/dL (ref 0.3–1.2)

## 2012-03-25 LAB — CBC
Hemoglobin: 11.4 g/dL — ABNORMAL LOW (ref 12.0–15.0)
MCH: 31.4 pg (ref 26.0–34.0)
MCHC: 32.9 g/dL (ref 30.0–36.0)
RDW: 12.9 % (ref 11.5–15.5)

## 2012-03-25 NOTE — Progress Notes (Signed)
Labs drawn today for cbc/diff,Beta 2 Micro, cmp,KLLC, MM panel , Viscosity

## 2012-03-26 LAB — VISCOSITY, SERUM: Viscosity, Serum: 2.4 rel to H2O — ABNORMAL HIGH (ref 1.5–1.9)

## 2012-03-26 LAB — BETA 2 MICROGLOBULIN, SERUM: Beta-2 Microglobulin: 2.11 mg/L — ABNORMAL HIGH (ref 1.01–1.73)

## 2012-03-27 LAB — MULTIPLE MYELOMA PANEL, SERUM
Albumin ELP: 45.9 % — ABNORMAL LOW (ref 55.8–66.1)
Alpha-1-Globulin: 3.4 % (ref 2.9–4.9)
IgA: 76 mg/dL (ref 69–380)
IgG (Immunoglobin G), Serum: 1090 mg/dL (ref 690–1700)
IgM, Serum: 3220 mg/dL — ABNORMAL HIGH (ref 52–322)
Total Protein: 8.7 g/dL — ABNORMAL HIGH (ref 6.0–8.3)

## 2012-03-28 ENCOUNTER — Telehealth (HOSPITAL_COMMUNITY): Payer: Self-pay

## 2012-03-28 NOTE — Telephone Encounter (Signed)
Message copied by Sterling Big on Fri Mar 28, 2012 11:47 AM ------      Message from: [name], ERIC S      Created: Fri Mar 28, 2012 11:30 AM       Call Cindy-all labs still very stable!

## 2012-04-02 ENCOUNTER — Other Ambulatory Visit (HOSPITAL_COMMUNITY): Payer: BC Managed Care – PPO

## 2012-04-04 ENCOUNTER — Encounter (HOSPITAL_COMMUNITY): Payer: Self-pay | Admitting: Oncology

## 2012-04-04 ENCOUNTER — Encounter (HOSPITAL_BASED_OUTPATIENT_CLINIC_OR_DEPARTMENT_OTHER): Payer: BC Managed Care – PPO | Admitting: Oncology

## 2012-04-04 ENCOUNTER — Other Ambulatory Visit (HOSPITAL_COMMUNITY): Payer: Self-pay | Admitting: Oncology

## 2012-04-04 VITALS — BP 80/57 | HR 78 | Temp 97.9°F | Wt 134.8 lb

## 2012-04-04 DIAGNOSIS — D472 Monoclonal gammopathy: Secondary | ICD-10-CM

## 2012-04-04 DIAGNOSIS — K432 Incisional hernia without obstruction or gangrene: Secondary | ICD-10-CM

## 2012-04-04 DIAGNOSIS — D649 Anemia, unspecified: Secondary | ICD-10-CM

## 2012-04-04 NOTE — Patient Instructions (Signed)
Lindsay Cook  [id number] Apr 03, 1951 Dr. Glenford Peers   [facility]  Discharge Instructions  RECOMMENDATIONS MADE BY THE CONSULTANT AND ANY TEST RESULTS WILL BE SENT TO YOUR REFERRING DOCTOR.   EXAM FINDINGS BY MD TODAY AND SIGNS AND SYMPTOMS TO REPORT TO CLINIC OR PRIMARY MD: you are doing well. Take your urine to the lab at the Cancer Center at [facility] (Ask for Lavonna Rua - Dr. [name] spoke with [name])  MEDICATIONS PRESCRIBED: none   INSTRUCTIONS GIVEN AND DISCUSSED: Other :  Report night sweats, fevers, infections that recur,etc.  SPECIAL INSTRUCTIONS/FOLLOW-UP: Lab work Needed in 3 months  and Return to Clinic after labs to see Dr. [name].   I acknowledge that I have been informed and understand all the instructions given to me and received a copy. I do not have any more questions at this time, but understand that I may call the Specialty Clinic at [facility] at [phone] during business hours should I have any further questions or need assistance in obtaining follow-up care.    __________________________________________  _____________  __________ Signature of Patient or Authorized Representative            Date                   Time    __________________________________________ Nurse's Signature

## 2012-04-04 NOTE — Progress Notes (Signed)
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  Problem #2 incisional hernia and she will be seen Dr. Avel Peace  Problem #3 minimal anemia  Lindsay Cook remains asymptomatic though she has noticed since losing approximately 50 pounds in weight with a lifestyle change in dietary changes a bulge in her left side of the abdomen right below the incision for benign lesion removed from the colon several years ago by Dr. Bonnee Quin.  This does not hurt her but it is about 7 cm across. I think it would be be larger if her weight was back to wear a was. Otherwise she states her bowels are working well no fevers chills etc. She has an excellent appetite adnexal and performance status is still 0. [place] is on vacation 4-5 miles every day.  Her labs are all very very stable. She will come back in 3 months for further labs. She forgot her urine but that will be dropped off at the [facility] laboratory today.

## 2012-04-10 ENCOUNTER — Ambulatory Visit (INDEPENDENT_AMBULATORY_CARE_PROVIDER_SITE_OTHER): Payer: Self-pay | Admitting: General Surgery

## 2012-04-17 ENCOUNTER — Encounter: Payer: Self-pay | Admitting: Oncology

## 2012-05-12 ENCOUNTER — Ambulatory Visit (INDEPENDENT_AMBULATORY_CARE_PROVIDER_SITE_OTHER): Payer: BC Managed Care – PPO | Admitting: General Surgery

## 2012-05-12 ENCOUNTER — Encounter (INDEPENDENT_AMBULATORY_CARE_PROVIDER_SITE_OTHER): Payer: Self-pay | Admitting: General Surgery

## 2012-05-12 VITALS — BP 118/77 | HR 81 | Temp 98.9°F | Ht 63.0 in | Wt 135.2 lb

## 2012-05-12 DIAGNOSIS — K432 Incisional hernia without obstruction or gangrene: Secondary | ICD-10-CM

## 2012-05-12 NOTE — Progress Notes (Signed)
Patient ID: Lindsay Cook, female   DOB: [birth year], 61 y.o.   MRN: [id number]  Chief Complaint  Patient presents with  . Pre-op Exam    eval inc hernia    HPI Lindsay Cook is a 61 y.o. female.   HPI  She is referred by Dr. [name] for evaluation of a ventral incisional hernia.  She had a right colectomy 1.5 years ago.  She has lost a significant amount of weight and began noticing a bulge above and to the right of her umbilicus.  It has not become larger.  It is not painful.  It does not interfere with eating or bowel habits.  Past Medical History  Diagnosis Date  . Waldenstrom's macroglobulinemia   . 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 Cancer     Past Surgical History  Procedure Date  . Partial collectomy   . Appendectomy 1969    Family History  Problem Relation Age of Onset  . Cancer Father     lung/brain  . Cancer Paternal Aunt     breast    Social History History  Substance Use Topics  . Smoking status: Never Smoker   . Smokeless tobacco: Never Used  . Alcohol Use: Yes     occasional beer    No Known Allergies  Current Outpatient Prescriptions  Medication Sig Dispense Refill  . acetaminophen (TYLENOL) 500 MG tablet Take 500 mg by mouth as needed.          Review of Systems Review of Systems  Constitutional: Negative for fever, chills and activity change.  Respiratory: Negative.   Cardiovascular: Negative.   Gastrointestinal: Negative.   Genitourinary: Negative.   Hematological: Negative.     Blood pressure 118/77, pulse 81, temperature 98.9 F (37.2 C), temperature source Temporal, height 5\' 3"  (1.6 m), weight 135 lb 3.2 oz (61.326 kg), SpO2 98.00%.  Physical Exam Physical Exam  Constitutional: She appears well-developed and well-nourished. No distress.  HENT:  Head: Normocephalic and atraumatic.  Eyes: No scleral icterus.  Abdominal: Soft. She exhibits no mass. There is no tenderness.       Midline  incision with reducible bulge and palpable fascial defect superolateral to the umbilicus    Data Reviewed Dr. Thornton Papas note.  Assessment    Asymptomatic ventral incisional hernia.  We discussed repair techniques.  She is not interested in repair currently.    Plan    If the hernia becomes symptomatic or larger, I advised her that it should be repaired and she understands this.       Lorcan Shelp J 05/12/2012, 11:27 AM

## 2012-05-12 NOTE — Patient Instructions (Signed)
Call if the hernia become uncomfortable or gets larger.

## 2012-06-18 ENCOUNTER — Encounter: Payer: Self-pay | Admitting: Oncology

## 2012-06-23 ENCOUNTER — Encounter (HOSPITAL_COMMUNITY): Payer: BC Managed Care – PPO | Attending: Oncology

## 2012-06-23 DIAGNOSIS — D472 Monoclonal gammopathy: Secondary | ICD-10-CM

## 2012-06-23 DIAGNOSIS — D649 Anemia, unspecified: Secondary | ICD-10-CM | POA: Insufficient documentation

## 2012-06-23 DIAGNOSIS — K439 Ventral hernia without obstruction or gangrene: Secondary | ICD-10-CM | POA: Insufficient documentation

## 2012-06-23 LAB — CBC
MCH: 31.3 pg (ref 26.0–34.0)
MCV: 96.4 fL (ref 78.0–100.0)
Platelets: 177 10*3/uL (ref 150–400)
RBC: 3.64 MIL/uL — ABNORMAL LOW (ref 3.87–5.11)
RDW: 13.1 % (ref 11.5–15.5)

## 2012-06-23 LAB — COMPREHENSIVE METABOLIC PANEL
AST: 18 U/L (ref 0–37)
Albumin: 3.5 g/dL (ref 3.5–5.2)
Alkaline Phosphatase: 78 U/L (ref 39–117)
Chloride: 102 mEq/L (ref 96–112)
Potassium: 3.9 mEq/L (ref 3.5–5.1)
Sodium: 140 mEq/L (ref 135–145)
Total Bilirubin: 0.3 mg/dL (ref 0.3–1.2)

## 2012-06-23 LAB — DIFFERENTIAL
Basophils Absolute: 0 10*3/uL (ref 0.0–0.1)
Basophils Relative: 0 % (ref 0–1)
Neutro Abs: 3.6 10*3/uL (ref 1.7–7.7)
Neutrophils Relative %: 52 % (ref 43–77)

## 2012-06-23 LAB — PROTEIN, URINE, 24 HOUR: Protein, 24H Urine: 94 mg/d (ref 50–100)

## 2012-06-23 NOTE — Progress Notes (Signed)
Labs drawn today for cbc/diff,cmp,kllc,mm panel, viscosity, 24 urine for protein

## 2012-06-24 LAB — KAPPA/LAMBDA LIGHT CHAINS
Kappa free light chain: 0.7 mg/dL (ref 0.33–1.94)
Lambda free light chains: 10.7 mg/dL — ABNORMAL HIGH (ref 0.57–2.63)

## 2012-06-25 LAB — VISCOSITY, SERUM: Viscosity, Serum: 2.5 rel to H2O — ABNORMAL HIGH (ref 1.5–1.9)

## 2012-06-26 LAB — MULTIPLE MYELOMA PANEL, SERUM
Alpha-2-Globulin: 9.4 % (ref 7.1–11.8)
Beta Globulin: 3.7 % — ABNORMAL LOW (ref 4.7–7.2)
Gamma Globulin: 32.7 % — ABNORMAL HIGH (ref 11.1–18.8)
IgG (Immunoglobin G), Serum: 936 mg/dL (ref 690–1700)
M-Spike, %: 2.26 g/dL

## 2012-06-30 ENCOUNTER — Other Ambulatory Visit (HOSPITAL_COMMUNITY): Payer: BC Managed Care – PPO

## 2012-07-02 ENCOUNTER — Encounter (HOSPITAL_COMMUNITY): Payer: Self-pay | Admitting: Oncology

## 2012-07-02 ENCOUNTER — Encounter (HOSPITAL_BASED_OUTPATIENT_CLINIC_OR_DEPARTMENT_OTHER): Payer: BC Managed Care – PPO | Admitting: Oncology

## 2012-07-02 VITALS — BP 99/66 | HR 79 | Temp 97.8°F | Resp 16 | Wt 134.5 lb

## 2012-07-02 DIAGNOSIS — D472 Monoclonal gammopathy: Secondary | ICD-10-CM

## 2012-07-02 DIAGNOSIS — D649 Anemia, unspecified: Secondary | ICD-10-CM

## 2012-07-02 NOTE — Patient Instructions (Addendum)
COOPER STAMP  DOB 10/30/1951 CSN [id number]  MRN [id number] Dr. Glenford Peers   [facility]  Discharge Instructions  RECOMMENDATIONS MADE BY THE CONSULTANT AND ANY TEST RESULTS WILL BE SENT TO YOUR REFERRING DOCTOR.   EXAM FINDINGS BY MD TODAY AND SIGNS AND SYMPTOMS TO REPORT TO CLINIC OR PRIMARY MD: Discussion by Dr. [name].  Will do MRI in October.  Take 1 xanax and 1 hydrocodone before you go for the MRI to help with your claustrophobia.  Make sure someone drives you for the scans.  MEDICATIONS PRESCRIBED: none   INSTRUCTIONS GIVEN AND DISCUSSED: Other :  Report recurring infections, fevers, night sweats, etc.  SPECIAL INSTRUCTIONS/FOLLOW-UP: Lab work Needed in December, Xray Studies Needed in October and Return to Clinic after labs in December.   I acknowledge that I have been informed and understand all the instructions given to me and received a copy. I do not have any more questions at this time, but understand that I may call the Specialty Clinic at [facility] at [phone] during business hours should I have any further questions or need assistance in obtaining follow-up care.    __________________________________________  _____________  __________ Signature of Patient or Authorized Representative            Date                   Time    __________________________________________ Nurse's Signature

## 2012-07-02 NOTE — Progress Notes (Signed)
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 Problem #3 minimal anemia probably secondary to #1 Lindsay Cook is feeling very good. She has lost 50 pounds with a change in her lifestyle and she is holding her own very nicely. She did see Dr. Abbey Chatters who will see her again if she becomes symptomatic or if this gets larger. She does not think the hernias changing presently and I will examine her the next time she's here.  Her labs are very stable which are reassuring that it is time to do her MRIs of her spine to make sure she is not developing bone lesions.  I will see her in December.

## 2012-07-04 ENCOUNTER — Other Ambulatory Visit (HOSPITAL_COMMUNITY): Payer: BC Managed Care – PPO

## 2012-07-17 ENCOUNTER — Encounter: Payer: Self-pay | Admitting: Oncology

## 2012-08-20 ENCOUNTER — Ambulatory Visit
Admission: RE | Admit: 2012-08-20 | Discharge: 2012-08-20 | Disposition: A | Discharge status: Home / Self Care | Payer: BC Managed Care – PPO | Source: Ambulatory Visit | Attending: Oncology | Admitting: Oncology

## 2012-08-20 DIAGNOSIS — D472 Monoclonal gammopathy: Secondary | ICD-10-CM

## 2012-10-08 ENCOUNTER — Encounter (HOSPITAL_COMMUNITY): Payer: BC Managed Care – PPO | Attending: Oncology

## 2012-10-08 DIAGNOSIS — D649 Anemia, unspecified: Secondary | ICD-10-CM | POA: Insufficient documentation

## 2012-10-08 DIAGNOSIS — K432 Incisional hernia without obstruction or gangrene: Secondary | ICD-10-CM | POA: Insufficient documentation

## 2012-10-08 DIAGNOSIS — D472 Monoclonal gammopathy: Secondary | ICD-10-CM

## 2012-10-08 LAB — PROTEIN, URINE, 24 HOUR
Collection Interval-UPROT: 24 hours
Protein, 24H Urine: 100 mg/d (ref 50–100)
Protein, Urine: 8 mg/dL

## 2012-10-08 LAB — COMPREHENSIVE METABOLIC PANEL
ALT: 16 U/L (ref 0–35)
Albumin: 3.4 g/dL — ABNORMAL LOW (ref 3.5–5.2)
Alkaline Phosphatase: 62 U/L (ref 39–117)
BUN: 18 mg/dL (ref 6–23)
Chloride: 101 mEq/L (ref 96–112)
Glucose, Bld: 88 mg/dL (ref 70–99)
Potassium: 3.9 mEq/L (ref 3.5–5.1)
Sodium: 137 mEq/L (ref 135–145)
Total Bilirubin: 0.2 mg/dL — ABNORMAL LOW (ref 0.3–1.2)
Total Protein: 8.5 g/dL — ABNORMAL HIGH (ref 6.0–8.3)

## 2012-10-08 LAB — CBC WITH DIFFERENTIAL/PLATELET
Hemoglobin: 11.2 g/dL — ABNORMAL LOW (ref 12.0–15.0)
Lymphs Abs: 2.3 10*3/uL (ref 0.7–4.0)
Monocytes Relative: 7 % (ref 3–12)
Neutro Abs: 3.2 10*3/uL (ref 1.7–7.7)
Neutrophils Relative %: 53 % (ref 43–77)
Platelets: 166 10*3/uL (ref 150–400)
RBC: 3.6 MIL/uL — ABNORMAL LOW (ref 3.87–5.11)
WBC: 6.1 10*3/uL (ref 4.0–10.5)

## 2012-10-08 LAB — CREATININE CLEARANCE, URINE, 24 HOUR
Collection Interval-CRCL: 24 hours
Urine Total Volume-CRCL: 1250 mL

## 2012-10-08 NOTE — Addendum Note (Signed)
Addended byLeida Lauth on: 10/08/2012 09:54 AM   Modules accepted: Orders

## 2012-10-08 NOTE — Progress Notes (Signed)
Labs drawn today , 24 hour urine also collected

## 2012-10-08 NOTE — Addendum Note (Signed)
Addended by: Oda Kilts on: 10/08/2012 10:02 AM   Modules accepted: Orders

## 2012-10-09 LAB — KAPPA/LAMBDA LIGHT CHAINS: Kappa free light chain: 0.9 mg/dL (ref 0.33–1.94)

## 2012-10-09 LAB — BETA 2 MICROGLOBULIN, SERUM: Beta-2 Microglobulin: 1.77 mg/L — ABNORMAL HIGH (ref 1.01–1.73)

## 2012-10-10 LAB — MULTIPLE MYELOMA PANEL, SERUM
Albumin ELP: 45.9 % — ABNORMAL LOW (ref 55.8–66.1)
Alpha-2-Globulin: 9.2 % (ref 7.1–11.8)
Beta 2: 4.4 % (ref 3.2–6.5)
Beta Globulin: 3.7 % — ABNORMAL LOW (ref 4.7–7.2)
IgA: 68 mg/dL — ABNORMAL LOW (ref 69–380)
IgM, Serum: 3390 mg/dL — ABNORMAL HIGH (ref 52–322)
Total Protein: 8.4 g/dL — ABNORMAL HIGH (ref 6.0–8.3)

## 2012-10-14 ENCOUNTER — Encounter (HOSPITAL_BASED_OUTPATIENT_CLINIC_OR_DEPARTMENT_OTHER): Payer: BC Managed Care – PPO | Admitting: Oncology

## 2012-10-14 ENCOUNTER — Encounter (HOSPITAL_COMMUNITY): Payer: Self-pay | Admitting: Oncology

## 2012-10-14 VITALS — BP 108/64 | HR 74 | Temp 97.8°F | Resp 18 | Wt 134.8 lb

## 2012-10-14 DIAGNOSIS — D472 Monoclonal gammopathy: Secondary | ICD-10-CM

## 2012-10-14 DIAGNOSIS — D649 Anemia, unspecified: Secondary | ICD-10-CM

## 2012-10-14 DIAGNOSIS — K432 Incisional hernia without obstruction or gangrene: Secondary | ICD-10-CM

## 2012-10-14 LAB — IMMUNOFIXATION, URINE: Immunofixation, Urine: 0

## 2012-10-14 NOTE — Patient Instructions (Addendum)
[facility]  Discharge Instructions  RECOMMENDATIONS MADE BY THE CONSULTANT AND ANY TEST RESULTS WILL BE SENT TO YOUR REFERRING DOCTOR.   EXAM FINDINGS BY MD TODAY AND SIGNS AND SYMPTOMS TO REPORT TO CLINIC OR PRIMARY MD:  Lab work in 3 months  Return to see Dr. [name] after labs in 3 months  I acknowledge that I have been informed and understand all the instructions given to me and received a copy. I do not have any more questions at this time, but understand that I may call the Specialty Clinic at [facility] at [phone] during business hours should I have any further questions or need assistance in obtaining follow-up care.    __________________________________________  _____________  __________ Signature of Patient or Authorized Representative            Date                   Time    __________________________________________ Nurse's Signature

## 2012-10-14 NOTE — Progress Notes (Signed)
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 The patient cannot always reduce this herself standing up she states but always lying down she is able to accomplish this. I had no trouble reducing this supine or standing. The opening is approximately 2-1/2 cm across. It is just to the left of the umbilicus. Bowel sounds were fine. She had no distention. Should no leg edema. Vital signs are stable. Problem #3 minimal anemia without change.  All of her monoclonal gammopathy labs are very stable. In October her bone MRIs were stable without any discernible lesions appreciated. Therefore we will continue to see her every 3 months and to monitor her this disorder as well as a hernia.

## 2012-11-18 ENCOUNTER — Encounter: Payer: Self-pay | Admitting: Oncology

## 2012-11-27 ENCOUNTER — Other Ambulatory Visit: Payer: Self-pay | Admitting: Obstetrics and Gynecology

## 2012-11-27 DIAGNOSIS — Z1231 Encounter for screening mammogram for malignant neoplasm of breast: Secondary | ICD-10-CM

## 2013-01-05 ENCOUNTER — Other Ambulatory Visit (HOSPITAL_COMMUNITY): Payer: Self-pay | Admitting: Oncology

## 2013-01-05 ENCOUNTER — Encounter (HOSPITAL_COMMUNITY): Payer: BC Managed Care – PPO | Attending: Oncology

## 2013-01-05 DIAGNOSIS — D472 Monoclonal gammopathy: Secondary | ICD-10-CM

## 2013-01-05 LAB — COMPREHENSIVE METABOLIC PANEL
ALT: 17 U/L (ref 0–35)
AST: 21 U/L (ref 0–37)
Alkaline Phosphatase: 69 U/L (ref 39–117)
CO2: 29 mEq/L (ref 19–32)
Calcium: 9.9 mg/dL (ref 8.4–10.5)
GFR calc non Af Amer: 88 mL/min — ABNORMAL LOW (ref >90)
Potassium: 4 mEq/L (ref 3.5–5.1)
Sodium: 140 mEq/L (ref 135–145)

## 2013-01-05 LAB — PROTEIN, URINE, 24 HOUR
Collection Interval-UPROT: 24 hours
Protein, 24H Urine: 90 mg/d (ref 50–100)
Protein, Urine: 9 mg/dL

## 2013-01-05 LAB — CBC WITH DIFFERENTIAL/PLATELET
Basophils Absolute: 0 10*3/uL (ref 0.0–0.1)
Eosinophils Relative: 1 % (ref 0–5)
Lymphocytes Relative: 41 % (ref 12–46)
Lymphs Abs: 2.6 10*3/uL (ref 0.7–4.0)
MCV: 95.6 fL (ref 78.0–100.0)
Neutro Abs: 3.2 10*3/uL (ref 1.7–7.7)
Neutrophils Relative %: 50 % (ref 43–77)
Platelets: 160 10*3/uL (ref 150–400)
RBC: 3.67 MIL/uL — ABNORMAL LOW (ref 3.87–5.11)
RDW: 13.1 % (ref 11.5–15.5)
WBC: 6.4 10*3/uL (ref 4.0–10.5)

## 2013-01-05 LAB — CREATININE CLEARANCE, URINE, 24 HOUR
Creatinine: 0.77 mg/dL (ref 0.50–1.10)
Urine Total Volume-CRCL: 1000 mL

## 2013-01-05 NOTE — Progress Notes (Signed)
Labs drawn today for cbc/diff,cmp,b72mic,visc,mm panel,kllc, 24 urine for crea  Clear,total protein, Immunfixation

## 2013-01-07 LAB — KAPPA/LAMBDA LIGHT CHAINS
Kappa, lambda light chain ratio: 0.08 — ABNORMAL LOW (ref 0.26–1.65)
Lambda free light chains: 12.1 mg/dL — ABNORMAL HIGH (ref 0.57–2.63)

## 2013-01-07 LAB — MULTIPLE MYELOMA PANEL, SERUM
Alpha-1-Globulin: 3.2 % (ref 2.9–4.9)
Beta 2: 3.7 % (ref 3.2–6.5)
Beta Globulin: 3.8 % — ABNORMAL LOW (ref 4.7–7.2)
Gamma Globulin: 33.2 % — ABNORMAL HIGH (ref 11.1–18.8)

## 2013-01-12 ENCOUNTER — Ambulatory Visit (HOSPITAL_COMMUNITY): Payer: BC Managed Care – PPO | Admitting: Oncology

## 2013-01-13 ENCOUNTER — Encounter (HOSPITAL_COMMUNITY): Payer: Self-pay | Admitting: Oncology

## 2013-01-13 ENCOUNTER — Ambulatory Visit
Admission: RE | Admit: 2013-01-13 | Discharge: 2013-01-13 | Disposition: A | Discharge status: Home / Self Care | Payer: BC Managed Care – PPO | Source: Ambulatory Visit | Attending: Obstetrics and Gynecology | Admitting: Obstetrics and Gynecology

## 2013-01-13 ENCOUNTER — Encounter (HOSPITAL_BASED_OUTPATIENT_CLINIC_OR_DEPARTMENT_OTHER): Payer: BC Managed Care – PPO | Admitting: Oncology

## 2013-01-13 ENCOUNTER — Telehealth (INDEPENDENT_AMBULATORY_CARE_PROVIDER_SITE_OTHER): Payer: Self-pay | Admitting: General Surgery

## 2013-01-13 VITALS — BP 79/56 | HR 82 | Temp 97.8°F | Resp 16 | Wt 134.2 lb

## 2013-01-13 DIAGNOSIS — D649 Anemia, unspecified: Secondary | ICD-10-CM

## 2013-01-13 DIAGNOSIS — K432 Incisional hernia without obstruction or gangrene: Secondary | ICD-10-CM

## 2013-01-13 DIAGNOSIS — Z1231 Encounter for screening mammogram for malignant neoplasm of breast: Secondary | ICD-10-CM

## 2013-01-13 DIAGNOSIS — D472 Monoclonal gammopathy: Secondary | ICD-10-CM

## 2013-01-13 NOTE — Patient Instructions (Addendum)
[facility] Discharge Instructions  RECOMMENDATIONS MADE BY THE CONSULTANT AND ANY TEST RESULTS WILL BE SENT TO YOUR REFERRING PHYSICIAN.  Continue lab work every 3 months. Return to see MD in 3 months after lab work.  Thank you for choosing [facility] to provide your oncology and hematology care.  To afford each patient quality time with our providers, please arrive at least 15 minutes before your scheduled appointment time.  With your help, our goal is to use those 15 minutes to complete the necessary work-up to ensure our physicians have the information they need to help with your evaluation and healthcare recommendations.    Effective January 1st, 2014, we ask that you re-schedule your appointment with our physicians should you arrive 10 or more minutes late for your appointment.  We strive to give you quality time with our providers, and arriving late affects you and other patients whose appointments are after yours.    Again, thank you for choosing [facility]  Our hope is that these requests will decrease the amount of time that you wait before being seen by our physicians.       _____________________________________________________________  Should you have questions after your visit to [facility], please contact our office at [phone] between the hours of 8:30 a.m. and 5:00 p.m.  Voicemails left after 4:30 p.m. will not be returned until the following business day.  For prescription refill requests, have your pharmacy contact our office with your prescription refill request.

## 2013-01-13 NOTE — Progress Notes (Signed)
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 He remains easily reducible  #3 minimal anemia secondary to #1 without change  Her vital signs are excellent, weight is stable.BP 79/56  Pulse 82  Temp(Src) 97.8 F (36.6 C) (Oral)  Resp 16  Wt 134 lb 3.2 oz (60.873 kg)  BMI 23.78 kg/m2  She is in no acute distress. She has no leg edema no arm edema. She has no lymphadenopathy in the cervical, supraclavicular, infraclavicular, axillary areas. She has no inguinal nodes. The incisional hernias very obvious now. The opening as best I could measure is 5 x 5 cm. Lungs are clear. Heart shows a regular rhythm and rate. HEENT exam is unremarkable.  Her laboratory work is extremely stable. We will continue to monitor her every 3 months and to MRIs again in October. In the meantime I have discussed her case with Dr. Abbey Chatters who will see her in the near future about the hernia.

## 2013-01-13 NOTE — Telephone Encounter (Signed)
Left VM for Lindsay Cook to call and confirm appt with Dr. Abbey Chatters on Tuesday, January 27, 2013, at 2:10 to evaluate her incisional hernia.  Lindsay Cook immediately called back and confirmed.

## 2013-01-27 ENCOUNTER — Encounter (INDEPENDENT_AMBULATORY_CARE_PROVIDER_SITE_OTHER): Payer: Self-pay | Admitting: General Surgery

## 2013-01-27 ENCOUNTER — Ambulatory Visit (INDEPENDENT_AMBULATORY_CARE_PROVIDER_SITE_OTHER): Payer: BC Managed Care – PPO | Admitting: General Surgery

## 2013-01-27 VITALS — BP 90/64 | HR 76 | Temp 98.4°F | Resp 16 | Ht 63.5 in | Wt 134.0 lb

## 2013-01-27 DIAGNOSIS — K432 Incisional hernia without obstruction or gangrene: Secondary | ICD-10-CM

## 2013-01-27 NOTE — Progress Notes (Signed)
Patient ID: Lindsay Cook, female   DOB: Jul 27, 1951, 63 y.o.   MRN: [id number]  Chief Complaint  Patient presents with  . New Evaluation    eval incisional hernia    HPI Lindsay Cook is a 62 y.o. female.   HPI  I was asked to see her again by Dr. [name] to re-evaluate her ventral incisional hernia.  She has been noticing that it is getting larger. However, she denies any pain or obstructive type symptoms from it. I saw her in July 2013 for this as well.  Past Medical History  Diagnosis Date  . Waldenstrom's macroglobulinemia   . 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 Cancer     Past Surgical History  Procedure Laterality Date  . Partial collectomy    . Appendectomy  1969    Family History  Problem Relation Age of Onset  . Cancer Father     lung/brain  . Cancer Paternal Aunt     breast    Social History History  Substance Use Topics  . Smoking status: Never Smoker   . Smokeless tobacco: Never Used  . Alcohol Use: Yes     Comment: occasional beer    No Known Allergies  Current Outpatient Prescriptions  Medication Sig Dispense Refill  . acetaminophen (TYLENOL) 500 MG tablet Take 500 mg by mouth as needed.         No current facility-administered medications for this visit.    Review of Systems Review of Systems  Constitutional: Negative.   Gastrointestinal: Positive for abdominal distention. Negative for vomiting, abdominal pain and constipation.  Allergic/Immunologic: Negative for immunocompromised state.    Blood pressure 90/64, pulse 76, temperature 98.4 F (36.9 C), temperature source Temporal, resp. rate 16, height 5' 3.5" (1.613 m), weight 134 lb (60.782 kg).  Physical Exam Physical Exam  Constitutional: She appears well-developed and well-nourished. No distress.  HENT:  Head: Normocephalic and atraumatic.  Abdominal: Soft. Bowel sounds are normal. She exhibits no distension and no mass. There is no  tenderness.  Midline scar with 6 cm reducible hernia in the epigastric region. Right paramedian scar.  Skin: Skin is warm and dry.  Psychiatric: She has a normal mood and affect. Her behavior is normal.    Data Reviewed Dr. Thornton Papas note.  Assessment    Enlarging ventral incisional hernia. I recommend she undergo repair.     Plan    Laparoscopic ventral incisional hernia repair with mesh.  I have discussed the procedure, risks, and aftercare. Risks include but are not limited to bleeding, infection, wound healing problems, anesthesia, recurrence, accidental injury to intra-abdominal organs-such as intestine, liver, spleen, bladder, etc. We also discussed the rare complication of mesh rejection. All questions were answered.        Grantland Want J 01/27/2013, 2:31 PM

## 2013-01-27 NOTE — Patient Instructions (Addendum)
Avoid repetitive heavy lifting. 

## 2013-02-04 ENCOUNTER — Encounter: Payer: Self-pay | Admitting: Oncology

## 2013-02-09 ENCOUNTER — Encounter (HOSPITAL_COMMUNITY): Payer: Self-pay | Admitting: Pharmacy Technician

## 2013-02-12 ENCOUNTER — Encounter (HOSPITAL_COMMUNITY)
Admission: RE | Admit: 2013-02-12 | Discharge: 2013-02-12 | Disposition: A | Discharge status: Home / Self Care | Payer: BC Managed Care – PPO | Source: Ambulatory Visit | Attending: General Surgery | Admitting: General Surgery

## 2013-02-12 ENCOUNTER — Encounter (HOSPITAL_COMMUNITY): Payer: Self-pay

## 2013-02-12 DIAGNOSIS — Z01812 Encounter for preprocedural laboratory examination: Secondary | ICD-10-CM | POA: Insufficient documentation

## 2013-02-12 DIAGNOSIS — K439 Ventral hernia without obstruction or gangrene: Secondary | ICD-10-CM | POA: Insufficient documentation

## 2013-02-12 HISTORY — DX: Ventral hernia without obstruction or gangrene: K43.9

## 2013-02-12 LAB — CBC WITH DIFFERENTIAL/PLATELET
Basophils Relative: 0 % (ref 0–1)
Eosinophils Absolute: 0.1 10*3/uL (ref 0.0–0.7)
HCT: 33.9 % — ABNORMAL LOW (ref 36.0–46.0)
Hemoglobin: 10.8 g/dL — ABNORMAL LOW (ref 12.0–15.0)
Lymphs Abs: 2.3 10*3/uL (ref 0.7–4.0)
MCH: 30.7 pg (ref 26.0–34.0)
MCHC: 31.9 g/dL (ref 30.0–36.0)
Monocytes Absolute: 0.6 10*3/uL (ref 0.1–1.0)
Monocytes Relative: 9 % (ref 3–12)
Neutrophils Relative %: 52 % (ref 43–77)
RBC: 3.52 MIL/uL — ABNORMAL LOW (ref 3.87–5.11)

## 2013-02-12 LAB — PROTIME-INR
INR: 1.02 (ref 0.00–1.49)
Prothrombin Time: 13.3 seconds (ref 11.6–15.2)

## 2013-02-12 LAB — COMPREHENSIVE METABOLIC PANEL
Alkaline Phosphatase: 68 U/L (ref 39–117)
BUN: 21 mg/dL (ref 6–23)
Chloride: 103 mEq/L (ref 96–112)
Creatinine, Ser: 0.78 mg/dL (ref 0.50–1.10)
GFR calc Af Amer: >90 mL/min (ref >90)
Glucose, Bld: 88 mg/dL (ref 70–99)
Potassium: 4.3 mEq/L (ref 3.5–5.1)
Total Bilirubin: 0.2 mg/dL — ABNORMAL LOW (ref 0.3–1.2)
Total Protein: 8.6 g/dL — ABNORMAL HIGH (ref 6.0–8.3)

## 2013-02-12 LAB — SURGICAL PCR SCREEN: MRSA, PCR: NEGATIVE

## 2013-02-12 NOTE — Patient Instructions (Addendum)
Lindsay Cook  02/12/2013                           YOUR PROCEDURE IS SCHEDULED ON:  03/02/13               PLEASE REPORT TO SHORT STAY CENTER AT : 7:00 AM               CALL THIS NUMBER IF ANY PROBLEMS THE DAY OF SURGERY :               [phone]                      REMEMBER:   Do not eat food or drink liquids AFTER MIDNIGHT   Take these medicines the morning of surgery with A SIP OF WATER: NONE   Do not wear jewelry, make-up   Do not wear lotions, powders, or perfumes.   Do not shave legs or underarms 12 hrs. before surgery (men may shave face)  Do not bring valuables to the hospital.  Contacts, dentures or bridgework may not be worn into surgery.  Leave suitcase in the car. After surgery it may be brought to your room.  For patients admitted to the hospital more than one night, checkout time is 11:00                          The day of discharge.   Patients discharged the day of surgery will not be allowed to drive home                             If going home same day of surgery, must have someone stay with you first                           24 hrs at home and arrange for some one to drive you home from hospital.    Special Instructions:   Please read over the following fact sheets that you were given:               1. MRSA  INFORMATION                      2. [facility] PREPARING FOR SURGERY SHEET                                                X_____________________________________________________________________        Failure to follow these instructions may result in cancellation of your surgery

## 2013-03-02 ENCOUNTER — Encounter (HOSPITAL_COMMUNITY)
Admission: RE | Disposition: A | Discharge status: Home / Self Care | Payer: Self-pay | Source: Ambulatory Visit | Attending: General Surgery

## 2013-03-02 ENCOUNTER — Ambulatory Visit (HOSPITAL_COMMUNITY): Payer: BC Managed Care – PPO | Admitting: Anesthesiology

## 2013-03-02 ENCOUNTER — Observation Stay (HOSPITAL_COMMUNITY)
Admission: RE | Admit: 2013-03-02 | Discharge: 2013-03-05 | Disposition: A | Discharge status: Home / Self Care | Payer: BC Managed Care – PPO | Source: Ambulatory Visit | Attending: General Surgery | Admitting: General Surgery

## 2013-03-02 ENCOUNTER — Encounter (HOSPITAL_COMMUNITY): Payer: Self-pay | Admitting: *Deleted

## 2013-03-02 ENCOUNTER — Encounter (HOSPITAL_COMMUNITY): Payer: Self-pay | Admitting: Anesthesiology

## 2013-03-02 DIAGNOSIS — K432 Incisional hernia without obstruction or gangrene: Secondary | ICD-10-CM

## 2013-03-02 DIAGNOSIS — K929 Disease of digestive system, unspecified: Secondary | ICD-10-CM | POA: Insufficient documentation

## 2013-03-02 DIAGNOSIS — Y838 Other surgical procedures as the cause of abnormal reaction of the patient, or of later complication, without mention of misadventure at the time of the procedure: Secondary | ICD-10-CM | POA: Insufficient documentation

## 2013-03-02 DIAGNOSIS — K560 Paralytic ileus: Secondary | ICD-10-CM | POA: Insufficient documentation

## 2013-03-02 HISTORY — PX: INSERTION OF MESH: SHX5868

## 2013-03-02 HISTORY — PX: VENTRAL HERNIA REPAIR: SHX424

## 2013-03-02 SURGERY — REPAIR, HERNIA, VENTRAL, LAPAROSCOPIC
Anesthesia: General | Wound class: Clean

## 2013-03-02 MED ORDER — ONDANSETRON HCL 4 MG/2ML IJ SOLN
INTRAMUSCULAR | Status: DC | PRN
  Administered 2013-03-02: 4 mg via INTRAVENOUS

## 2013-03-02 MED ORDER — BUPIVACAINE-EPINEPHRINE PF 0.5-1:200000 % IJ SOLN
INTRAMUSCULAR | Status: DC | PRN
  Administered 2013-03-02: 10 mL

## 2013-03-02 MED ORDER — MORPHINE SULFATE 2 MG/ML IJ SOLN
2.0000 mg | INTRAMUSCULAR | Status: DC | PRN
  Administered 2013-03-02: 4 mg via INTRAVENOUS
  Administered 2013-03-02: 2 mg via INTRAVENOUS
  Administered 2013-03-02 – 2013-03-03 (×2): 4 mg via INTRAVENOUS
  Administered 2013-03-03 (×2): 2 mg via INTRAVENOUS
  Filled 2013-03-02 (×3): qty 1
  Filled 2013-03-02 (×3): qty 2

## 2013-03-02 MED ORDER — DEXAMETHASONE SODIUM PHOSPHATE 10 MG/ML IJ SOLN
INTRAMUSCULAR | Status: DC | PRN
  Administered 2013-03-02: 8 mg via INTRAVENOUS

## 2013-03-02 MED ORDER — MORPHINE SULFATE 10 MG/ML IJ SOLN: 2.0000 mg | INTRAMUSCULAR | Status: DC | PRN

## 2013-03-02 MED ORDER — GLYCOPYRROLATE 0.2 MG/ML IJ SOLN
INTRAMUSCULAR | Status: DC | PRN
  Administered 2013-03-02: 0.4 mg via INTRAVENOUS

## 2013-03-02 MED ORDER — NEOSTIGMINE METHYLSULFATE 1 MG/ML IJ SOLN
INTRAMUSCULAR | Status: DC | PRN
  Administered 2013-03-02: 3 mg via INTRAVENOUS

## 2013-03-02 MED ORDER — MORPHINE SULFATE 2 MG/ML IJ SOLN
INTRAMUSCULAR | Status: AC
  Administered 2013-03-02: 2 mg via INTRAVENOUS
  Filled 2013-03-02: qty 1

## 2013-03-02 MED ORDER — MORPHINE SULFATE 10 MG/ML IJ SOLN
1.0000 mg | INTRAMUSCULAR | Status: DC | PRN
  Administered 2013-03-02 (×5): 2 mg via INTRAVENOUS

## 2013-03-02 MED ORDER — PROPOFOL 10 MG/ML IV BOLUS
INTRAVENOUS | Status: DC | PRN
  Administered 2013-03-02: 150 mg via INTRAVENOUS

## 2013-03-02 MED ORDER — KCL-LACTATED RINGERS-D5W 20 MEQ/L IV SOLN
INTRAVENOUS | Status: DC
  Administered 2013-03-02 – 2013-03-05 (×6): via INTRAVENOUS
  Filled 2013-03-02 (×9): qty 1000

## 2013-03-02 MED ORDER — LACTATED RINGERS IV SOLN
INTRAVENOUS | Status: DC
  Administered 2013-03-02: 1000 mL via INTRAVENOUS
  Administered 2013-03-02 (×2): via INTRAVENOUS

## 2013-03-02 MED ORDER — MORPHINE SULFATE 2 MG/ML IJ SOLN: 2.0000 mg | INTRAMUSCULAR | Status: DC | PRN

## 2013-03-02 MED ORDER — FENTANYL CITRATE 0.05 MG/ML IJ SOLN
INTRAMUSCULAR | Status: DC | PRN
  Administered 2013-03-02 (×3): 50 ug via INTRAVENOUS

## 2013-03-02 MED ORDER — LACTATED RINGERS IR SOLN
Status: DC | PRN
  Administered 2013-03-02: 1000 mL

## 2013-03-02 MED ORDER — CEFAZOLIN SODIUM-DEXTROSE 2-3 GM-% IV SOLR
INTRAVENOUS | Status: AC
Start: 2013-03-02 — End: 2013-03-02
  Filled 2013-03-02: qty 50

## 2013-03-02 MED ORDER — CEFAZOLIN SODIUM-DEXTROSE 2-3 GM-% IV SOLR
2.0000 g | INTRAVENOUS | Status: AC
  Administered 2013-03-02: 2 g via INTRAVENOUS

## 2013-03-02 MED ORDER — OXYCODONE-ACETAMINOPHEN 5-325 MG PO TABS
1.0000 | ORAL_TABLET | ORAL | Status: DC | PRN
  Administered 2013-03-03: 1 via ORAL
  Filled 2013-03-02: qty 1

## 2013-03-02 MED ORDER — ACETAMINOPHEN 10 MG/ML IV SOLN
INTRAVENOUS | Status: AC
  Filled 2013-03-02: qty 100

## 2013-03-02 MED ORDER — CEFAZOLIN SODIUM 1-5 GM-% IV SOLN
1.0000 g | Freq: Four times a day (QID) | INTRAVENOUS | Status: AC
  Administered 2013-03-02 – 2013-03-03 (×3): 1 g via INTRAVENOUS
  Filled 2013-03-02 (×3): qty 50

## 2013-03-02 MED ORDER — BUPIVACAINE HCL (PF) 0.5 % IJ SOLN
INTRAMUSCULAR | Status: AC
  Filled 2013-03-02: qty 30

## 2013-03-02 MED ORDER — ROCURONIUM BROMIDE 100 MG/10ML IV SOLN
INTRAVENOUS | Status: DC | PRN
  Administered 2013-03-02: 30 mg via INTRAVENOUS
  Administered 2013-03-02: 10 mg via INTRAVENOUS
  Administered 2013-03-02 (×2): 5 mg via INTRAVENOUS

## 2013-03-02 MED ORDER — MIDAZOLAM HCL 5 MG/5ML IJ SOLN
INTRAMUSCULAR | Status: DC | PRN
  Administered 2013-03-02: 2 mg via INTRAVENOUS

## 2013-03-02 MED ORDER — ACETAMINOPHEN 10 MG/ML IV SOLN
INTRAVENOUS | Status: DC | PRN
  Administered 2013-03-02: 1000 mg via INTRAVENOUS

## 2013-03-02 MED ORDER — EPHEDRINE SULFATE 50 MG/ML IJ SOLN
INTRAMUSCULAR | Status: DC | PRN
  Administered 2013-03-02: 15 mg via INTRAVENOUS
  Administered 2013-03-02: 10 mg via INTRAVENOUS

## 2013-03-02 MED ORDER — LIDOCAINE HCL (CARDIAC) 20 MG/ML IV SOLN
INTRAVENOUS | Status: DC | PRN
  Administered 2013-03-02: 100 mg via INTRAVENOUS

## 2013-03-02 MED ORDER — LACTATED RINGERS IV SOLN: INTRAVENOUS | Status: DC

## 2013-03-02 MED ORDER — MORPHINE SULFATE 10 MG/ML IJ SOLN
INTRAMUSCULAR | Status: AC
  Filled 2013-03-02: qty 1

## 2013-03-02 MED ORDER — PROMETHAZINE HCL 25 MG/ML IJ SOLN: 6.2500 mg | INTRAMUSCULAR | Status: DC | PRN

## 2013-03-02 MED ORDER — SUCCINYLCHOLINE CHLORIDE 20 MG/ML IJ SOLN
INTRAMUSCULAR | Status: DC | PRN
  Administered 2013-03-02: 100 mg via INTRAVENOUS

## 2013-03-02 SURGICAL SUPPLY — 49 items
BANDAGE ADHESIVE 1X3 (GAUZE/BANDAGES/DRESSINGS) ×8 IMPLANT
BENZOIN TINCTURE PRP APPL 2/3 (GAUZE/BANDAGES/DRESSINGS) ×2 IMPLANT
BINDER ABD UNIV 12 45-62 (WOUND CARE) IMPLANT
BINDER ABDOMINAL 46IN 62IN (WOUND CARE)
CABLE HIGH FREQUENCY MONO STRZ (ELECTRODE) ×2 IMPLANT
CANISTER SUCTION 2500CC (MISCELLANEOUS) ×2 IMPLANT
CANNULA ENDOPATH XCEL 11M (ENDOMECHANICALS) IMPLANT
CHLORAPREP W/TINT 26ML (MISCELLANEOUS) ×2 IMPLANT
CLOTH BEACON ORANGE TIMEOUT ST (SAFETY) ×2 IMPLANT
DECANTER SPIKE VIAL GLASS SM (MISCELLANEOUS) IMPLANT
DERMABOND ADVANCED (GAUZE/BANDAGES/DRESSINGS)
DERMABOND ADVANCED .7 DNX12 (GAUZE/BANDAGES/DRESSINGS) IMPLANT
DEVICE TROCAR PUNCTURE CLOSURE (ENDOMECHANICALS) ×2 IMPLANT
DISSECTOR BLUNT TIP ENDO 5MM (MISCELLANEOUS) ×2 IMPLANT
DRAPE INCISE IOBAN 66X45 STRL (DRAPES) ×2 IMPLANT
DRAPE LAPAROSCOPIC ABDOMINAL (DRAPES) ×2 IMPLANT
DRAPE UTILITY XL STRL (DRAPES) ×2 IMPLANT
DRSG TEGADERM 2-3/8X2-3/4 SM (GAUZE/BANDAGES/DRESSINGS) ×6 IMPLANT
ELECT REM PT RETURN 9FT ADLT (ELECTROSURGICAL) ×2
ELECTRODE REM PT RTRN 9FT ADLT (ELECTROSURGICAL) ×1 IMPLANT
GLOVE BIOGEL PI IND STRL 7.0 (GLOVE) ×1 IMPLANT
GLOVE BIOGEL PI INDICATOR 7.0 (GLOVE) ×1
GLOVE ECLIPSE 8.0 STRL XLNG CF (GLOVE) ×12 IMPLANT
GLOVE INDICATOR 8.0 STRL GRN (GLOVE) ×4 IMPLANT
GOWN STRL NON-REIN LRG LVL3 (GOWN DISPOSABLE) ×2 IMPLANT
GOWN STRL REIN XL XLG (GOWN DISPOSABLE) ×6 IMPLANT
KIT BASIN OR (CUSTOM PROCEDURE TRAY) ×2 IMPLANT
MARKER SKIN DUAL TIP RULER LAB (MISCELLANEOUS) ×2 IMPLANT
MESH PARIETEX 25X20 (Mesh General) ×2 IMPLANT
NEEDLE SPNL 22GX3.5 QUINCKE BK (NEEDLE) ×2 IMPLANT
NS IRRIG 1000ML POUR BTL (IV SOLUTION) ×2 IMPLANT
PENCIL BUTTON HOLSTER BLD 10FT (ELECTRODE) IMPLANT
SCALPEL HARMONIC ACE (MISCELLANEOUS) ×2 IMPLANT
SCISSORS LAP 5X35 DISP (ENDOMECHANICALS) ×2 IMPLANT
SET IRRIG TUBING LAPAROSCOPIC (IRRIGATION / IRRIGATOR) ×2 IMPLANT
SOLUTION ANTI FOG 6CC (MISCELLANEOUS) ×2 IMPLANT
STRIP CLOSURE SKIN 1/2X4 (GAUZE/BANDAGES/DRESSINGS) ×2 IMPLANT
SUT MNCRL AB 4-0 PS2 18 (SUTURE) ×2 IMPLANT
SUT NOVA NAB DX-16 0-1 5-0 T12 (SUTURE) ×4 IMPLANT
SUT PROLENE 0 CT 1 CR/8 (SUTURE) IMPLANT
TACKER 5MM HERNIA 3.5CML NAB (ENDOMECHANICALS) ×4 IMPLANT
TOWEL OR 17X26 10 PK STRL BLUE (TOWEL DISPOSABLE) ×2 IMPLANT
TRAY FOLEY CATH 14FRSI W/METER (CATHETERS) ×2 IMPLANT
TRAY LAP CHOLE (CUSTOM PROCEDURE TRAY) ×2 IMPLANT
TROCAR BLADELESS OPT 5 75 (ENDOMECHANICALS) ×6 IMPLANT
TROCAR XCEL BLUNT TIP 100MML (ENDOMECHANICALS) IMPLANT
TROCAR XCEL NON-BLD 11X100MML (ENDOMECHANICALS) ×2 IMPLANT
TUBING INSUFFLATION 10FT LAP (TUBING) ×2 IMPLANT
WARMER LAPAROSCOPE (MISCELLANEOUS) IMPLANT

## 2013-03-02 NOTE — Interval H&P Note (Signed)
History and Physical Interval Note:  03/02/2013 9:06 AM  Lindsay Cook  has presented today for surgery, with the diagnosis of ventral hernia  The various methods of treatment have been discussed with the patient and family. After consideration of risks, benefits and other options for treatment, the patient has consented to  Procedure(s): LAPAROSCOPIC VENTRAL HERNIA (N/A) INSERTION OF MESH (N/A) as a surgical intervention .  The patient's history has been reviewed, patient examined, no change in status, stable for surgery.  I have reviewed the patient's chart and labs.  Questions were answered to the patient's satisfaction.     Dimple Bastyr Shela Commons

## 2013-03-02 NOTE — Anesthesia Preprocedure Evaluation (Signed)
Anesthesia Evaluation  Patient identified by MRN, date of birth, ID band Patient awake    Reviewed: Allergy & Precautions, H&P , NPO status , Patient's Chart, lab work & pertinent test results  Airway Mallampati: II TM Distance: >3 FB Neck ROM: Full    Dental  (+) Teeth Intact and Dental Advisory Given   Pulmonary neg pulmonary ROS,  breath sounds clear to auscultation  Pulmonary exam normal       Cardiovascular negative cardio ROS  Rhythm:Regular Rate:Normal     Neuro/Psych negative neurological ROS  negative psych ROS   GI/Hepatic negative GI ROS, Neg liver ROS,   Endo/Other  negative endocrine ROS  Renal/GU negative Renal ROS  negative genitourinary   Musculoskeletal negative musculoskeletal ROS (+)   Abdominal   Peds  Hematology negative hematology ROS (+)   Anesthesia Other Findings   Reproductive/Obstetrics negative OB ROS                           Anesthesia Physical Anesthesia Plan  ASA: II  Anesthesia Plan: General   Post-op Pain Management:    Induction: Intravenous  Airway Management Planned: Oral ETT  Additional Equipment:   Intra-op Plan:   Post-operative Plan: Extubation in OR  Informed Consent: I have reviewed the patients History and Physical, chart, labs and discussed the procedure including the risks, benefits and alternatives for the proposed anesthesia with the patient or authorized representative who has indicated his/her understanding and acceptance.   Dental advisory given  Plan Discussed with: CRNA  Anesthesia Plan Comments:         Anesthesia Quick Evaluation

## 2013-03-02 NOTE — Transfer of Care (Signed)
Immediate Anesthesia Transfer of Care Note  Patient: Lindsay Cook  Procedure(s) Performed: Procedure(s): LAPAROSCOPIC VENTRAL HERNIA (N/A) INSERTION OF MESH (N/A)  Patient Location: PACU  Anesthesia Type:General  Level of Consciousness: sedated  Airway & Oxygen Therapy: Patient Spontanous Breathing and Patient connected to face mask oxygen  Post-op Assessment: Report given to PACU RN and Post -op Vital signs reviewed and stable  Post vital signs: Reviewed and stable  Complications: No apparent anesthesia complications

## 2013-03-02 NOTE — H&P (Signed)
Lindsay Cook is an 62 y.o. female.   Chief Complaint:   Here for elective surgery HPI:   She has a long standing ventral incisional hernia that is enlarging and starting to become symptomatic.  She now presents for repair  Past Medical History  Diagnosis Date  . 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 Cancer     WALDENSTROMS MACROGLOBULINEMIA  . Ventral hernia     Past Surgical History  Procedure Laterality Date  . Partial collectomy    . Appendectomy  1969    Family History  Problem Relation Age of Onset  . Cancer Father     lung/brain  . Cancer Paternal Aunt     breast   Social History:  reports that she has never smoked. She has never used smokeless tobacco. She reports that  drinks alcohol. She reports that she does not use illicit drugs.  Allergies: No Known Allergies  Medications Prior to Admission  Medication Sig Dispense Refill  . acetaminophen (TYLENOL) 500 MG tablet Take 500 mg by mouth every 6 (six) hours as needed for pain.         Results for orders placed during the hospital encounter of 03/02/13 (from the past 48 hour(s))  TYPE AND SCREEN     Status: None   Collection Time    03/02/13  7:20 AM      Result Value Range   ABO/RH(D) O POS     Antibody Screen NEG     Sample Expiration 03/05/2013     No results found.  Review of Systems  Constitutional: Negative for fever and chills.  HENT: Negative for congestion and sore throat.   Respiratory: Negative for cough.   Gastrointestinal: Negative for nausea, vomiting, abdominal pain and diarrhea.  Genitourinary: Negative for dysuria and hematuria.    Blood pressure 97/64, pulse 72, temperature 97 F (36.1 C), temperature source Oral, resp. rate 18, SpO2 99.00%. Physical Exam  Constitutional: She appears well-developed and well-nourished. No distress.  HENT:  Head: Normocephalic and atraumatic.  Eyes: No scleral icterus.  Neck: Neck supple.  Cardiovascular: Normal  rate and regular rhythm.   Respiratory: Effort normal and breath sounds normal.  GI: Soft.  Multiple scars.  Palpable bulge and fascial defect in epigastric area.  Musculoskeletal: She exhibits no edema.  Lymphadenopathy:    She has no cervical adenopathy.  Neurological: She is alert.  Skin: Skin is warm and dry.     Assessment/Plan Ventral incisional hernia  Plan:  Laparoscopic possible open ventral incisional hernia repair with mesh.  Ziyad Dyar J 03/02/2013, 9:03 AM

## 2013-03-02 NOTE — Anesthesia Postprocedure Evaluation (Signed)
Anesthesia Post Note  Patient: Lindsay Cook  Procedure(s) Performed: Procedure(s) (LRB): LAPAROSCOPIC VENTRAL HERNIA (N/A) INSERTION OF MESH (N/A)  Anesthesia type: General  Patient location: PACU  Post pain: Pain level controlled  Post assessment: Post-op Vital signs reviewed  Last Vitals:  Filed Vitals:   03/02/13 1230  BP:   Pulse:   Temp: 35.9 C  Resp:     Post vital signs: Reviewed  Level of consciousness: sedated  Complications: No apparent anesthesia complications

## 2013-03-02 NOTE — Op Note (Signed)
Operative Note  Lindsay Cook female 62 y.o. 03/02/2013  PREOPERATIVE DX:  Ventral incisional hernia  POSTOPERATIVE DX:  Same  PROCEDURE:  Laparoscopic ventral incisional hernia repair with mesh (Parietex composite)         Surgeon: Adolph Pollack   Assistants: Chevis Pretty M.D.  Anesthesia: General endotracheal anesthesia  Indications: This is a 62 year old female who had a laparoscopic assisted right colectomy in the past. She has developed an enlarging and symptomatic ventral incisional hernia in the epigastric region. She now presents for repair.    Procedure Detail:  She was brought to the operating room placed supine on the operating table and a general anesthetic was administered. A Foley catheter was inserted. The abdominal wall was widely sterilely prepped and draped.  3 previous left upper quadrant subcostal scar small incision was made. Using a 5 mm Optiview trocar and laparoscope access to the peritoneal cavity was gained and a pneumoperitoneum was created. The area under the trocar was visualized and there was no evidence of bleeding organ injury. A second 5 mm trocar was placed in the left lateral abdomen. Adhesions were noted between the omentum and hernia sac as well as the small intestine and hernia sac. Using careful sharp and blunt dissection lysis of adhesions was performed.  A 5 mm trocar was placed in the right lower quadrant. An 11 mm trocar is placed in the right upper quadrant. Further lysis of adhesions and mobilization of adhesions between the small intestine and hernia sac and the omentum and hernia sac was performed. This took approximately 45-60 minutes.  Once the hernia was exposed the falciform ligament was mobilized. Using a spinal needle the edges of the hernia were marked on the skin and then a distance of 4 cm was measured away from the edges. An appropriate size piece of composite mesh was chosen (20 cm x 25 cm) and cut to the appropriate size. 4  anchoring sutures of #1 Novofil were placed at the 12, 3, 6, and 9:00 positions. The mesh was hydrated. This mesh was then placed into the peritoneal cavity through the 11 mm trocar. The mesh was deployed so that the nonadherent side faced the viscera and the rough side faced the abdominal wall.  At the 12, 3, 6, 9:00 positions of the abdominal wall around the hernia a small incision was made. The anchoring sutures was then brought up through the small incision across a fascial bridge in its position. There are then tied down anchoring the mesh to the anterior abdominal wall. The periphery of the mesh was then further anchored to the abdominal wall with spiral tacks.  In inner rim of spiral tacks was also placed. This allowed for adequate coverage with good overlap of the hernia.  I then inspected the abdominal cavity. There is some old blood which was evacuated. There was some bleeding from the omentum which is controlled with harmonic scalpel. The abdominal cavity was then irrigated. A four-quadrant inspection was performed. There is no evidence of bleeding or organ injury.  The trocars were then removed and the pneumoperitoneum was released.  All skin incisions were closed with 4-0 Monocryl subcuticular stitches followed by Steri-Strips and sterile dressings.  She tolerated the procedure well without any apparent complication and was taken to recovery room in satisfactory condition.  Estimated Blood Loss:  200 mL         Drains: none  Blood Given: none          Specimens: none  Complications:  * No complications entered in OR log *         Disposition: PACU - hemodynamically stable.         Condition: stable

## 2013-03-03 ENCOUNTER — Encounter (HOSPITAL_COMMUNITY): Payer: Self-pay | Admitting: General Surgery

## 2013-03-03 MED ORDER — MENTHOL 3 MG MT LOZG
1.0000 | LOZENGE | OROMUCOSAL | Status: DC | PRN
  Filled 2013-03-03: qty 9

## 2013-03-03 MED ORDER — KETOROLAC TROMETHAMINE 30 MG/ML IJ SOLN
30.0000 mg | Freq: Four times a day (QID) | INTRAMUSCULAR | Status: AC
  Administered 2013-03-03 – 2013-03-04 (×6): 30 mg via INTRAVENOUS
  Filled 2013-03-03 (×6): qty 1

## 2013-03-03 MED ORDER — ONDANSETRON HCL 4 MG/2ML IJ SOLN
4.0000 mg | INTRAMUSCULAR | Status: DC | PRN
Start: 2013-03-03 — End: 2013-03-05
  Administered 2013-03-03 – 2013-03-04 (×3): 4 mg via INTRAVENOUS
  Filled 2013-03-03 (×3): qty 2

## 2013-03-03 NOTE — Care Management Note (Signed)
    Page 1 of 1   03/03/2013     11:28:55 AM   CARE MANAGEMENT NOTE 03/03/2013  Patient:  Lindsay Cook, Lindsay Cook   Account Number:  [id number]  Date Initiated:  03/03/2013  Documentation initiated by:  Lorenda Ishihara  Subjective/Objective Assessment:   62 yo female admitted s/p lap ventral hernia repair. PTA lived at home alone.     Action/Plan:   Home when stable   Anticipated DC Date:  03/04/2013   Anticipated DC Plan:  HOME/SELF CARE      DC Planning Services  CM consult      Choice offered to / List presented to:             Status of service:  Completed, signed off Medicare Important Message given?   (If response is "NO", the following Medicare IM given date fields will be blank) Date Medicare IM given:   Date Additional Medicare IM given:    Discharge Disposition:  HOME/SELF CARE  Per UR Regulation:  Reviewed for med. necessity/level of care/duration of stay  If discussed at Long Length of Stay Meetings, dates discussed:    Comments:

## 2013-03-03 NOTE — Progress Notes (Addendum)
1 Day Post-Op  Subjective: Very sore.  Not moving well.  Trying to use spirometry.  Objective: Vital signs in last 24 hours: Temp:  [96 F (35.6 C)-98.8 F (37.1 C)] 97.9 F (36.6 C) (04/29 0508) Pulse Rate:  [52-78] 78 (04/29 0508) Resp:  [8-18] 18 (04/29 0508) BP: (85-108)/(47-64) 94/64 mmHg (04/29 0508) SpO2:  [94 %-100 %] 97 % (04/29 0508) Weight:  [134 lb (60.782 kg)] 134 lb (60.782 kg) (04/28 1348)    Intake/Output from previous day: 04/28 0701 - 04/29 0700 In: 4520 [P.O.:720; I.V.:3800] Out: 2175 [Urine:2175] Intake/Output this shift:    PE: General- In NAD Abdomen-soft, dressings dry, hypoactive bowel sounds  Lab Results:  No results found for this basename: WBC, HGB, HCT, PLT,  in the last 72 hours BMET No results found for this basename: NA, K, CL, CO2, GLUCOSE, BUN, CREATININE, CALCIUM,  in the last 72 hours PT/INR No results found for this basename: LABPROT, INR,  in the last 72 hours Comprehensive Metabolic Panel:    Component Value Date/Time   NA 140 02/12/2013 0940   K 4.3 02/12/2013 0940   CL 103 02/12/2013 0940   CO2 30 02/12/2013 0940   BUN 21 02/12/2013 0940   CREATININE 0.78 02/12/2013 0940   CREATININE 0.77 01/05/2013 1000   GLUCOSE 88 02/12/2013 0940   CALCIUM 9.5 02/12/2013 0940   AST 20 02/12/2013 0940   ALT 15 02/12/2013 0940   ALKPHOS 68 02/12/2013 0940   BILITOT 0.2* 02/12/2013 0940   PROT 8.6* 02/12/2013 0940   ALBUMIN 3.3* 02/12/2013 0940     Studies/Results: No results found.  Anti-infectives: Anti-infectives   Start     Dose/Rate Route Frequency Ordered Stop   03/02/13 1500  ceFAZolin (ANCEF) IVPB 1 g/50 mL premix     1 g 100 mL/hr over 30 Minutes Intravenous Every 6 hours 03/02/13 1359 03/03/13 0311   03/02/13 0701  ceFAZolin (ANCEF) IVPB 2 g/50 mL premix     2 g 100 mL/hr over 30 Minutes Intravenous On call to O.R. 03/02/13 0701 03/02/13 0915      Assessment Active Problems:  Laparoscopic ventral incisional hernia with mesh  03/02/13-problem with pain control.    LOS: 1 day   Plan:  Leave foley in one more day.  Add Toradol.  OOB.   Tally Mattox J 03/03/2013

## 2013-03-04 MED ORDER — KETOROLAC TROMETHAMINE 30 MG/ML IJ SOLN
30.0000 mg | Freq: Four times a day (QID) | INTRAMUSCULAR | Status: DC
  Administered 2013-03-04 – 2013-03-05 (×2): 30 mg via INTRAVENOUS
  Filled 2013-03-04 (×6): qty 1

## 2013-03-04 MED ORDER — ENOXAPARIN SODIUM 40 MG/0.4ML ~~LOC~~ SOLN
40.0000 mg | SUBCUTANEOUS | Status: DC
  Administered 2013-03-04: 40 mg via SUBCUTANEOUS
  Filled 2013-03-04 (×2): qty 0.4

## 2013-03-04 MED ORDER — ALUM & MAG HYDROXIDE-SIMETH 200-200-20 MG/5ML PO SUSP
30.0000 mL | Freq: Four times a day (QID) | ORAL | Status: DC | PRN
  Administered 2013-03-04: 30 mL via ORAL
  Filled 2013-03-04: qty 30

## 2013-03-04 NOTE — Progress Notes (Signed)
2 Days Post-Op  Subjective: Had a rough night with abdominal distension.  Passing gas now and distension is better.  Had an episode of heartburn.   Objective: Vital signs in last 24 hours: Temp:  [97.7 F (36.5 C)-99.1 F (37.3 C)] 98.1 F (36.7 C) (04/30 0605) Pulse Rate:  [66-87] 77 (04/30 0605) Resp:  [18-20] 18 (04/30 0605) BP: (91-109)/(49-73) 98/60 mmHg (04/30 0605) SpO2:  [93 %-97 %] 96 % (04/30 0605)    Intake/Output from previous day: 04/29 0701 - 04/30 0700 In: 3435 [P.O.:1560; I.V.:1875] Out: 5100 [Urine:5100] Intake/Output this shift:    PE: General- In NAD Abdomen-soft,some distension, dressings dry, hypoactive bowel sounds  Lab Results:  No results found for this basename: WBC, HGB, HCT, PLT,  in the last 72 hours BMET No results found for this basename: NA, K, CL, CO2, GLUCOSE, BUN, CREATININE, CALCIUM,  in the last 72 hours PT/INR No results found for this basename: LABPROT, INR,  in the last 72 hours Comprehensive Metabolic Panel:    Component Value Date/Time   NA 140 02/12/2013 0940   K 4.3 02/12/2013 0940   CL 103 02/12/2013 0940   CO2 30 02/12/2013 0940   BUN 21 02/12/2013 0940   CREATININE 0.78 02/12/2013 0940   CREATININE 0.77 01/05/2013 1000   GLUCOSE 88 02/12/2013 0940   CALCIUM 9.5 02/12/2013 0940   AST 20 02/12/2013 0940   ALT 15 02/12/2013 0940   ALKPHOS 68 02/12/2013 0940   BILITOT 0.2* 02/12/2013 0940   PROT 8.6* 02/12/2013 0940   ALBUMIN 3.3* 02/12/2013 0940     Studies/Results: No results found.  Anti-infectives: Anti-infectives   Start     Dose/Rate Route Frequency Ordered Stop   03/02/13 1500  ceFAZolin (ANCEF) IVPB 1 g/50 mL premix     1 g 100 mL/hr over 30 Minutes Intravenous Every 6 hours 03/02/13 1359 03/03/13 0311   03/02/13 0701  ceFAZolin (ANCEF) IVPB 2 g/50 mL premix     2 g 100 mL/hr over 30 Minutes Intravenous On call to O.R. 03/02/13 0701 03/02/13 0915      Assessment Active Problems:  Laparoscopic ventral incisional  hernia with mesh 03/02/13-mild postop ileus    LOS: 2 days   Plan:  Remove foley.  Start Lovenox.  Keep on clear liquids.   Lindsay Cook 03/04/2013

## 2013-03-05 MED ORDER — OXYCODONE-ACETAMINOPHEN 5-325 MG PO TABS: 1.0000 | ORAL_TABLET | ORAL | Status: DC | PRN

## 2013-03-05 MED ORDER — SODIUM CHLORIDE 0.9 % IJ SOLN: 3.0000 mL | INTRAMUSCULAR | Status: DC | PRN

## 2013-03-05 NOTE — Progress Notes (Signed)
3 Days Post-Op  Subjective: Feels much better.  No having much pain.  Passing gas.  Tolerating liquids.  Wants to go home.   Objective: Vital signs in last 24 hours: Temp:  [98.4 F (36.9 C)-99 F (37.2 C)] 98.8 F (37.1 C) (05/01 0630) Pulse Rate:  [60-70] 60 (05/01 0630) Resp:  [16-18] 16 (05/01 0630) BP: (107-120)/(58-63) 107/58 mmHg (05/01 0630) SpO2:  [96 %-97 %] 97 % (05/01 0630)    Intake/Output from previous day: 04/30 0701 - 05/01 0700 In: 2520 [P.O.:720; I.V.:1800] Out: 2600 [Urine:2600] Intake/Output this shift:    PE: General- In NAD Abdomen-soft, nondistended, incisions clean and intact  Lab Results:  No results found for this basename: WBC, HGB, HCT, PLT,  in the last 72 hours BMET No results found for this basename: NA, K, CL, CO2, GLUCOSE, BUN, CREATININE, CALCIUM,  in the last 72 hours PT/INR No results found for this basename: LABPROT, INR,  in the last 72 hours Comprehensive Metabolic Panel:    Component Value Date/Time   NA 140 02/12/2013 0940   K 4.3 02/12/2013 0940   CL 103 02/12/2013 0940   CO2 30 02/12/2013 0940   BUN 21 02/12/2013 0940   CREATININE 0.78 02/12/2013 0940   CREATININE 0.77 01/05/2013 1000   GLUCOSE 88 02/12/2013 0940   CALCIUM 9.5 02/12/2013 0940   AST 20 02/12/2013 0940   ALT 15 02/12/2013 0940   ALKPHOS 68 02/12/2013 0940   BILITOT 0.2* 02/12/2013 0940   PROT 8.6* 02/12/2013 0940   ALBUMIN 3.3* 02/12/2013 0940     Studies/Results: No results found.  Anti-infectives: Anti-infectives   Start     Dose/Rate Route Frequency Ordered Stop   03/02/13 1500  ceFAZolin (ANCEF) IVPB 1 g/50 mL premix     1 g 100 mL/hr over 30 Minutes Intravenous Every 6 hours 03/02/13 1359 03/03/13 0311   03/02/13 0701  ceFAZolin (ANCEF) IVPB 2 g/50 mL premix     2 g 100 mL/hr over 30 Minutes Intravenous On call to O.R. 03/02/13 0701 03/02/13 0915      Assessment Active Problems:  Laparoscopic ventral incisional hernia with mesh 03/02/13-bowel  function returning and feeling much better overall    LOS: 3 days   Plan:  Regular diet.  If tolerated will discharge.  Discharge instructions given.   Mardel Grudzien J 03/05/2013

## 2013-03-10 NOTE — Discharge Summary (Signed)
Physician Discharge Summary  Patient ID: Lindsay Cook MRN: [id number] DOB/AGE: 11/22/1950 62 y.o.  Admit date: 03/02/2013 Discharge date: 03/05/2013  Admission Diagnoses:  Ventral incisional hernia  Discharge Diagnoses:  Ventral incisional hernia   Discharged Condition: good  Hospital Course: She underwent a laparoscopic ventral incisional hernia repair with mesh 03/02/2013. Postoperatively, she had some problems with pain control but this eventually improved. Her bowel function started to return. Incisions were clean and intact. By her third postoperative day she was feeling much better, she was tolerating her diet, and she was able to be discharged.  She was given discharge instructions.  Consults: None  Significant Diagnostic Studies: none  Treatments: surgery: A laparoscopic ventral incisional hernia repair with mesh 03/02/2013  Discharge Exam: Blood pressure 107/58, pulse 60, temperature 98.8 F (37.1 C), temperature source Oral, resp. rate 16, height 5\' 4"  (1.626 m), weight 134 lb (60.782 kg), SpO2 97.00%.   Disposition: 01-Home or Self Care   Future Appointments Provider Department Dept Phone   03/19/2013 10:50 AM Adolph Pollack, MD [facility] [place] [phone]   04/07/2013 8:30 AM [facility] [phone]   04/14/2013 9:00 AM [facility] Covering Provider [facility] [phone]       Medication List    TAKE these medications       acetaminophen 500 MG tablet  Commonly known as:  TYLENOL  Take 500 mg by mouth every 6 (six) hours as needed for pain.     oxyCODONE-acetaminophen 5-325 MG per tablet  Commonly known as:  PERCOCET/ROXICET  Take 1-2 tablets by mouth every 4 (four) hours as needed.         Signed: Adolph Pollack 03/10/2013, 1:15 PM

## 2013-03-19 ENCOUNTER — Ambulatory Visit (INDEPENDENT_AMBULATORY_CARE_PROVIDER_SITE_OTHER): Payer: BC Managed Care – PPO | Admitting: General Surgery

## 2013-03-19 VITALS — BP 100/60 | HR 84 | Resp 14 | Ht 64.0 in | Wt 130.2 lb

## 2013-03-19 DIAGNOSIS — Z9889 Other specified postprocedural states: Secondary | ICD-10-CM

## 2013-03-19 NOTE — Patient Instructions (Signed)
Continue light activities. 

## 2013-03-19 NOTE — Progress Notes (Signed)
Procedure:  Laparoscopic repair of ventral incisional hernia with mesh  Date:  03/02/2013  Pathology: na  History:  She is here for her first postoperative visit and is doing well. She no longer needs to take any pain medication. She is wearing an abdominal binder. She is walking a fair amount. She is driving  Exam: General- Is in NAD. Abdomen-soft, incisions are clean and intact, hernia repair is solid, small seroma is present.  Assessment:  Doing well postoperatively.  Plan:  Continue light activities. Return visit in 4 weeks.

## 2013-04-02 ENCOUNTER — Ambulatory Visit: Payer: Self-pay | Admitting: Nurse Practitioner

## 2013-04-07 ENCOUNTER — Encounter (HOSPITAL_COMMUNITY): Payer: BC Managed Care – PPO | Attending: Oncology

## 2013-04-07 DIAGNOSIS — D472 Monoclonal gammopathy: Secondary | ICD-10-CM

## 2013-04-07 LAB — COMPREHENSIVE METABOLIC PANEL
BUN: 23 mg/dL (ref 6–23)
CO2: 30 mEq/L (ref 19–32)
Chloride: 99 mEq/L (ref 96–112)
Creatinine, Ser: 0.98 mg/dL (ref 0.50–1.10)
GFR calc non Af Amer: 60 mL/min — ABNORMAL LOW (ref >90)
Total Bilirubin: 0.3 mg/dL (ref 0.3–1.2)

## 2013-04-07 LAB — CBC WITH DIFFERENTIAL/PLATELET
HCT: 33.7 % — ABNORMAL LOW (ref 36.0–46.0)
Hemoglobin: 11.3 g/dL — ABNORMAL LOW (ref 12.0–15.0)
Lymphocytes Relative: 36 % (ref 12–46)
Monocytes Absolute: 0.5 10*3/uL (ref 0.1–1.0)
Monocytes Relative: 8 % (ref 3–12)
Neutro Abs: 3.1 10*3/uL (ref 1.7–7.7)
WBC: 5.8 10*3/uL (ref 4.0–10.5)

## 2013-04-07 LAB — CREATININE CLEARANCE, URINE, 24 HOUR
Creatinine: 0.98 mg/dL (ref 0.50–1.10)
Urine Total Volume-CRCL: 1525 mL

## 2013-04-07 LAB — PROTEIN, URINE, 24 HOUR: Urine Total Volume-UPROT: 1525 mL

## 2013-04-07 NOTE — Progress Notes (Signed)
Labs drawn today for beta 2 mic,cbc/diff,cmp,viscosity,24 urine for crea clearance,protein, immunfixation

## 2013-04-08 LAB — BETA 2 MICROGLOBULIN, SERUM: Beta-2 Microglobulin: 2.53 mg/L — ABNORMAL HIGH (ref 1.01–1.73)

## 2013-04-09 LAB — UIFE/LIGHT CHAINS/TP QN, 24-HR UR
Albumin, U: DETECTED
Beta, Urine: DETECTED — AB
Free Kappa/Lambda Ratio: 0.68 ratio — ABNORMAL LOW (ref 2.04–10.37)
Free Lambda Lt Chains,Ur: 1.01 mg/dL — ABNORMAL HIGH (ref 0.02–0.67)
Time: 24 hours
Total Protein, Urine-Ur/day: 34 mg/d (ref 10–140)

## 2013-04-09 LAB — VISCOSITY, SERUM: Viscosity, Serum: 2.4 rel to H2O — ABNORMAL HIGH (ref 1.5–1.9)

## 2013-04-14 ENCOUNTER — Ambulatory Visit (HOSPITAL_COMMUNITY): Payer: BC Managed Care – PPO

## 2013-04-27 ENCOUNTER — Encounter (INDEPENDENT_AMBULATORY_CARE_PROVIDER_SITE_OTHER): Payer: Self-pay | Admitting: General Surgery

## 2013-04-27 ENCOUNTER — Ambulatory Visit (INDEPENDENT_AMBULATORY_CARE_PROVIDER_SITE_OTHER): Payer: BC Managed Care – PPO | Admitting: General Surgery

## 2013-04-27 VITALS — BP 94/60 | HR 68 | Temp 97.6°F | Resp 15 | Ht 63.0 in | Wt 132.0 lb

## 2013-04-27 DIAGNOSIS — Z9889 Other specified postprocedural states: Secondary | ICD-10-CM

## 2013-04-27 NOTE — Progress Notes (Signed)
Procedure:  Laparoscopic repair of ventral incisional hernia with mesh  Date:  03/02/2013  Pathology: na  History:  She is here for her second postoperative visit and is doing better.  She has been more active.  Exam: General- Is in NAD. Abdomen-soft, incisions are clean and intact, hernia repair is solid, small seroma has resolved.  Assessment:  Continues to do well postoperatively.  Plan:  Resume normal activities as tolerated.  Return prn.

## 2013-04-27 NOTE — Patient Instructions (Signed)
Avoid repetitive heavy lifting. 

## 2013-04-28 ENCOUNTER — Encounter (HOSPITAL_COMMUNITY): Payer: BC Managed Care – PPO

## 2013-04-28 ENCOUNTER — Other Ambulatory Visit (HOSPITAL_COMMUNITY): Payer: Self-pay

## 2013-04-28 DIAGNOSIS — D472 Monoclonal gammopathy: Secondary | ICD-10-CM

## 2013-04-28 NOTE — Progress Notes (Signed)
Lindsay Cook presented for labwork. Labs per MD order drawn via Peripheral Line 23 gauge needle inserted in rt ac Good blood return present. Procedure without incident.  Needle removed intact. Patient tolerated procedure well.   No charge for this visit since patient had to return for additional bloodwork that we forgot to draw.

## 2013-04-30 LAB — MULTIPLE MYELOMA PANEL, SERUM
Albumin ELP: 48.5 % — ABNORMAL LOW (ref 55.8–66.1)
Alpha-1-Globulin: 3.3 % (ref 2.9–4.9)
Alpha-2-Globulin: 9.2 % (ref 7.1–11.8)
Beta 2: 3.8 % (ref 3.2–6.5)
Beta Globulin: 4.3 % — ABNORMAL LOW (ref 4.7–7.2)
IgM, Serum: 3310 mg/dL — ABNORMAL HIGH (ref 52–322)

## 2013-05-12 ENCOUNTER — Ambulatory Visit (HOSPITAL_COMMUNITY): Payer: BC Managed Care – PPO

## 2013-06-09 DIAGNOSIS — C9000 Multiple myeloma not having achieved remission: Secondary | ICD-10-CM | POA: Insufficient documentation

## 2013-06-09 DIAGNOSIS — Z9089 Acquired absence of other organs: Secondary | ICD-10-CM | POA: Insufficient documentation

## 2013-06-18 ENCOUNTER — Other Ambulatory Visit: Payer: Self-pay | Admitting: Internal Medicine

## 2013-06-18 DIAGNOSIS — C9000 Multiple myeloma not having achieved remission: Secondary | ICD-10-CM

## 2013-06-18 DIAGNOSIS — G44221 Chronic tension-type headache, intractable: Secondary | ICD-10-CM | POA: Insufficient documentation

## 2013-06-18 DIAGNOSIS — M720 Palmar fascial fibromatosis [Dupuytren]: Secondary | ICD-10-CM | POA: Insufficient documentation

## 2013-06-18 DIAGNOSIS — Z9049 Acquired absence of other specified parts of digestive tract: Secondary | ICD-10-CM | POA: Insufficient documentation

## 2013-08-17 ENCOUNTER — Other Ambulatory Visit: Payer: BC Managed Care – PPO

## 2013-08-18 ENCOUNTER — Ambulatory Visit
Admission: RE | Admit: 2013-08-18 | Discharge: 2013-08-18 | Disposition: A | Discharge status: Home / Self Care | Payer: BC Managed Care – PPO | Source: Ambulatory Visit | Attending: Internal Medicine | Admitting: Internal Medicine

## 2013-08-18 DIAGNOSIS — C9000 Multiple myeloma not having achieved remission: Secondary | ICD-10-CM

## 2013-09-10 ENCOUNTER — Other Ambulatory Visit: Payer: Self-pay

## 2013-12-14 ENCOUNTER — Other Ambulatory Visit: Payer: Self-pay

## 2013-12-14 DIAGNOSIS — Z1231 Encounter for screening mammogram for malignant neoplasm of breast: Secondary | ICD-10-CM

## 2014-01-14 ENCOUNTER — Ambulatory Visit
Admission: RE | Admit: 2014-01-14 | Discharge: 2014-01-14 | Disposition: A | Discharge status: Home / Self Care | Payer: BC Managed Care – PPO | Source: Ambulatory Visit

## 2014-01-14 DIAGNOSIS — Z1231 Encounter for screening mammogram for malignant neoplasm of breast: Secondary | ICD-10-CM

## 2014-04-12 ENCOUNTER — Encounter: Payer: Self-pay | Admitting: Nurse Practitioner

## 2014-04-12 ENCOUNTER — Ambulatory Visit (INDEPENDENT_AMBULATORY_CARE_PROVIDER_SITE_OTHER): Payer: BC Managed Care – PPO | Admitting: Nurse Practitioner

## 2014-04-12 VITALS — BP 86/58 | HR 64 | Ht 63.0 in | Wt 135.0 lb

## 2014-04-12 DIAGNOSIS — Z01419 Encounter for gynecological examination (general) (routine) without abnormal findings: Secondary | ICD-10-CM

## 2014-04-12 DIAGNOSIS — D472 Monoclonal gammopathy: Secondary | ICD-10-CM

## 2014-04-12 DIAGNOSIS — Z0000 Encounter for general adult medical examination without abnormal findings: Secondary | ICD-10-CM

## 2014-04-12 NOTE — Progress Notes (Signed)
Patient ID: Lindsay Cook, female   DOB: March 02, 1951, 63 y.o.   MRN: [id number] 62 y.o. G0P0 Widowed Caucasian Fe here for annual exam.  Now seeing specialist [facility].  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  Yearly MRI of the spine has been normal. Not dating or SA. Previous relationship ended with his death at about 4.5 years ago.   Patient's last menstrual period was 11/05/2002.          Sexually active: no  The current method of family planning is abstinence and post menopausal status.    Exercising: yes  Home exercise routine includes walking everyday.  25 miles per week.. Smoker:  no  Health Maintenance: Pap:  01/31/10, WNL MMG:  01/14/14, Bi-Rads 1:  negative Colonoscopy:  10/210, repeat in 5 years BMD:  Never  TDaP:  2008 Labs:  Hematology/Oncology   reports that she has never smoked. She has never used smokeless tobacco. She reports that she drinks alcohol. She reports that she does not use illicit drugs.  Past Medical History  Diagnosis Date  . 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 Cancer     WALDENSTROMS MACROGLOBULINEMIA  . Ventral hernia     Past Surgical History  Procedure Laterality Date  . Partial collectomy    . Appendectomy  1969  . Ventral hernia repair N/A 03/02/2013    Procedure: LAPAROSCOPIC VENTRAL HERNIA;  Surgeon: Odis Hollingshead, MD;  [facility]: [facility];  Service: General;  Laterality: N/A;  . Insertion of mesh N/A 03/02/2013    Procedure: INSERTION OF MESH;  Surgeon: Odis Hollingshead, MD;  Location: [facility];  Service: General;  Laterality: N/A;    Current Outpatient Prescriptions  Medication Sig Dispense Refill  . acetaminophen (TYLENOL) 500 MG tablet Take 500 mg by mouth every 6 (six) hours as needed for pain.        No current facility-administered medications for this visit.    Family History  Problem Relation Age of Onset  . Cancer Father     lung/brain  . Cancer Paternal Aunt     breast     ROS:  Pertinent items are noted in HPI.  Otherwise, a comprehensive ROS was negative.  Exam:   BP 86/58  Pulse 64  Ht 5\' 3"  (1.6 m)  Wt 135 lb (61.236 kg)  BMI 23.92 kg/m2  LMP 11/05/2002 Height: 5\' 3"  (160 cm)  Ht Readings from Last 3 Encounters:  04/12/14 5\' 3"  (1.6 m)  04/27/13 5\' 3"  (1.6 m)  03/19/13 5\' 4"  (1.626 m)    General appearance: alert, cooperative and appears stated age Head: Normocephalic, without obvious abnormality, atraumatic Neck: no adenopathy, supple, symmetrical, trachea midline and thyroid normal to inspection and palpation Lungs: clear to auscultation bilaterally Breasts: normal appearance, no masses or tenderness Heart: regular rate and rhythm Abdomen: soft, non-tender; no masses,  no organomegaly Extremities: extremities normal, atraumatic, no cyanosis or edema Skin: Skin color, texture, turgor normal. No rashes or lesions Lymph nodes: Cervical, supraclavicular, and axillary nodes normal. No abnormal inguinal nodes palpated Neurologic: Grossly normal   Pelvic: External genitalia:  no lesions              Urethra:  normal appearing urethra with no masses, tenderness or lesions              Bartholin's and Skene's: normal                 Vagina: normal appearing vagina  with normal color and discharge, no lesions              Cervix: anteverted              Pap taken: yes Bimanual Exam:  Uterus:  normal size, contour, position, consistency, mobility, non-tender              Adnexa: no mass, fullness, tenderness               Rectovaginal: Confirms               Anus:  normal sphincter tone, no lesions  A:  Well Woman with normal exam  Postmenopausal  S/P repair of ventral hernia 04/27/13 with mesh and has done well.  Now sees hematologist at [facility] and new diagnosis of Waldenstrom macroglobulinemia  P:   Reviewed health and wellness pertinent to exam  Pap smear taken today  Mammogram is due 3/16  Counseled on breast self exam, mammography  screening, adequate intake of calcium and vitamin D, diet and exercise return annually or prn  An After Visit Summary was printed and given to the patient.

## 2014-04-12 NOTE — Assessment & Plan Note (Signed)
Avoid nephrotic agents such as NSAID'S, gold compounds, penicillamine, Ace inhibitors and lithium

## 2014-04-12 NOTE — Patient Instructions (Signed)

## 2014-04-15 LAB — IPS PAP TEST WITH HPV

## 2014-04-18 NOTE — Progress Notes (Signed)
Encounter reviewed by Dr. Brook Silva.  

## 2014-06-09 ENCOUNTER — Encounter (INDEPENDENT_AMBULATORY_CARE_PROVIDER_SITE_OTHER): Payer: Self-pay | Admitting: General Surgery

## 2014-06-09 ENCOUNTER — Ambulatory Visit (INDEPENDENT_AMBULATORY_CARE_PROVIDER_SITE_OTHER): Payer: BC Managed Care – PPO | Admitting: General Surgery

## 2014-06-09 VITALS — BP 124/72 | HR 70 | Temp 98.0°F | Resp 18 | Ht 64.0 in | Wt 134.0 lb

## 2014-06-09 DIAGNOSIS — IMO0002 Reserved for concepts with insufficient information to code with codable children: Secondary | ICD-10-CM

## 2014-06-09 DIAGNOSIS — S39011A Strain of muscle, fascia and tendon of abdomen, initial encounter: Secondary | ICD-10-CM | POA: Insufficient documentation

## 2014-06-09 NOTE — Progress Notes (Signed)
Patient ID: Lindsay Cook, female   DOB: August 07, 1951, 63 y.o.   MRN: [id number]  Chief Complaint  Patient presents with  . Follow-up    abd. pain    HPI Lindsay Cook is a 63 y.o. female.   HPI  She is self-referred and is here because of new onset of lower abdominal pain that began 3 weeks ago. At first it was quite severe and is exacerbated when she gets up from a lying position or sneezes. He is getting somewhat better at this time. It is below that level only umbilicus and just to the right. She denies noticing any swelling. No change in bowel or bladder habits. She did have some nausea and chills last night.  Past Medical History  Diagnosis Date  . 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 Cancer     WALDENSTROMS MACROGLOBULINEMIA  . Ventral hernia     Past Surgical History  Procedure Laterality Date  . Partial collectomy    . Appendectomy  1969  . Ventral hernia repair N/A 03/02/2013    Procedure: LAPAROSCOPIC VENTRAL HERNIA;  Surgeon: Odis Hollingshead, MD;  [facility];  Service: General;  Laterality: N/A;  . Insertion of mesh N/A 03/02/2013    Procedure: INSERTION OF MESH;  Surgeon: Odis Hollingshead, MD;  [facility];  Service: General;  Laterality: N/A;    Family History  Problem Relation Age of Onset  . Cancer Father     lung/brain  . Cancer Paternal Aunt     breast    Social History History  Substance Use Topics  . Smoking status: Never Smoker   . Smokeless tobacco: Never Used  . Alcohol Use: Yes     Comment: occasional beer    No Known Allergies  Current Outpatient Prescriptions  Medication Sig Dispense Refill  . acetaminophen (TYLENOL) 500 MG tablet Take 500 mg by mouth every 6 (six) hours as needed for pain.        No current facility-administered medications for this visit.    Review of Systems Review of Systems  Constitutional: Positive for chills. Negative for fever.  Gastrointestinal: Positive for  nausea and abdominal pain. Negative for abdominal distention.  Genitourinary: Negative for dysuria and difficulty urinating.    Blood pressure 124/72, pulse 70, temperature 98 F (36.7 C), resp. rate 18, height 5\' 4"  (1.626 m), weight 134 lb (60.782 kg), last menstrual period 11/05/2002.  Physical Exam Physical Exam  Constitutional: She appears well-developed and well-nourished. No distress.  HENT:  Head: Normocephalic and atraumatic.  Pulmonary/Chest:  Multiple scars including in the. Umbilical region as well as lower abdomen. There are no bulges consistent with a hernia. There are no palpable masses.    Data Reviewed Previous operative note and office visit note.  Assessment    Acute abdominal wall muscular strain. This is slowly improving. She is only taking Tylenol this time and that helps.     Plan    I told her she could resume activities that did not cause any discomfort. I explained to her that this could take 6-12 weeks to get better. If she develops some swelling or a bulge in that area, I have asked her to call us back.        Sylvain Hasten J 06/09/2014, 12:36 PM

## 2014-06-09 NOTE — Patient Instructions (Signed)
Avoid activities that cause the pain as much as possible. There is no evidence of a hernia. This can take 6-12 weeks to get better.

## 2014-07-06 DIAGNOSIS — C9000 Multiple myeloma not having achieved remission: Secondary | ICD-10-CM | POA: Diagnosis not present

## 2014-07-21 ENCOUNTER — Other Ambulatory Visit: Payer: Self-pay | Admitting: Nurse Practitioner

## 2014-07-21 DIAGNOSIS — C9000 Multiple myeloma not having achieved remission: Secondary | ICD-10-CM

## 2014-07-27 DIAGNOSIS — M272 Inflammatory conditions of jaws: Secondary | ICD-10-CM | POA: Diagnosis not present

## 2014-08-23 ENCOUNTER — Other Ambulatory Visit: Payer: BC Managed Care – PPO

## 2014-08-23 ENCOUNTER — Ambulatory Visit
Admission: RE | Admit: 2014-08-23 | Discharge: 2014-08-23 | Disposition: A | Discharge status: Home / Self Care | Payer: Medicare Other | Source: Ambulatory Visit | Attending: Nurse Practitioner | Admitting: Nurse Practitioner

## 2014-08-23 DIAGNOSIS — C9000 Multiple myeloma not having achieved remission: Secondary | ICD-10-CM

## 2014-08-23 DIAGNOSIS — M47812 Spondylosis without myelopathy or radiculopathy, cervical region: Secondary | ICD-10-CM | POA: Diagnosis not present

## 2014-08-23 DIAGNOSIS — M5126 Other intervertebral disc displacement, lumbar region: Secondary | ICD-10-CM | POA: Diagnosis not present

## 2014-08-23 DIAGNOSIS — M5124 Other intervertebral disc displacement, thoracic region: Secondary | ICD-10-CM | POA: Diagnosis not present

## 2014-08-23 DIAGNOSIS — M4802 Spinal stenosis, cervical region: Secondary | ICD-10-CM | POA: Diagnosis not present

## 2014-08-23 DIAGNOSIS — M47816 Spondylosis without myelopathy or radiculopathy, lumbar region: Secondary | ICD-10-CM | POA: Diagnosis not present

## 2014-09-14 DIAGNOSIS — C9000 Multiple myeloma not having achieved remission: Secondary | ICD-10-CM | POA: Diagnosis not present

## 2014-09-14 DIAGNOSIS — Z79899 Other long term (current) drug therapy: Secondary | ICD-10-CM | POA: Diagnosis not present

## 2014-09-20 DIAGNOSIS — C9000 Multiple myeloma not having achieved remission: Secondary | ICD-10-CM | POA: Diagnosis not present

## 2014-09-20 DIAGNOSIS — Z23 Encounter for immunization: Secondary | ICD-10-CM | POA: Diagnosis not present

## 2014-12-13 ENCOUNTER — Other Ambulatory Visit: Payer: Self-pay

## 2014-12-13 DIAGNOSIS — Z1231 Encounter for screening mammogram for malignant neoplasm of breast: Secondary | ICD-10-CM

## 2014-12-14 DIAGNOSIS — Z79899 Other long term (current) drug therapy: Secondary | ICD-10-CM | POA: Diagnosis not present

## 2014-12-14 DIAGNOSIS — C9000 Multiple myeloma not having achieved remission: Secondary | ICD-10-CM | POA: Diagnosis not present

## 2015-01-03 DIAGNOSIS — Z9089 Acquired absence of other organs: Secondary | ICD-10-CM | POA: Diagnosis not present

## 2015-01-03 DIAGNOSIS — R220 Localized swelling, mass and lump, head: Secondary | ICD-10-CM | POA: Diagnosis not present

## 2015-01-03 DIAGNOSIS — D649 Anemia, unspecified: Secondary | ICD-10-CM | POA: Diagnosis not present

## 2015-01-03 DIAGNOSIS — C9000 Multiple myeloma not having achieved remission: Secondary | ICD-10-CM | POA: Diagnosis not present

## 2015-01-03 DIAGNOSIS — Z6824 Body mass index (BMI) 24.0-24.9, adult: Secondary | ICD-10-CM | POA: Diagnosis not present

## 2015-01-03 DIAGNOSIS — R51 Headache: Secondary | ICD-10-CM | POA: Diagnosis not present

## 2015-01-03 DIAGNOSIS — Z23 Encounter for immunization: Secondary | ICD-10-CM | POA: Diagnosis not present

## 2015-01-24 ENCOUNTER — Ambulatory Visit
Admission: RE | Admit: 2015-01-24 | Discharge: 2015-01-24 | Disposition: A | Discharge status: Home / Self Care | Payer: Medicare Other | Source: Ambulatory Visit

## 2015-01-24 DIAGNOSIS — Z1231 Encounter for screening mammogram for malignant neoplasm of breast: Secondary | ICD-10-CM | POA: Diagnosis not present

## 2015-05-12 DIAGNOSIS — C9000 Multiple myeloma not having achieved remission: Secondary | ICD-10-CM | POA: Diagnosis not present

## 2015-05-23 DIAGNOSIS — Z6823 Body mass index (BMI) 23.0-23.9, adult: Secondary | ICD-10-CM | POA: Diagnosis not present

## 2015-05-23 DIAGNOSIS — C9000 Multiple myeloma not having achieved remission: Secondary | ICD-10-CM | POA: Diagnosis not present

## 2015-06-28 DIAGNOSIS — D126 Benign neoplasm of colon, unspecified: Secondary | ICD-10-CM | POA: Diagnosis not present

## 2015-07-19 ENCOUNTER — Ambulatory Visit (INDEPENDENT_AMBULATORY_CARE_PROVIDER_SITE_OTHER): Payer: Medicare Other | Admitting: Nurse Practitioner

## 2015-07-19 ENCOUNTER — Encounter: Payer: Self-pay | Admitting: Nurse Practitioner

## 2015-07-19 VITALS — BP 110/64 | HR 64 | Ht 62.25 in | Wt 136.0 lb

## 2015-07-19 DIAGNOSIS — Z124 Encounter for screening for malignant neoplasm of cervix: Secondary | ICD-10-CM | POA: Diagnosis not present

## 2015-07-19 DIAGNOSIS — Z01419 Encounter for gynecological examination (general) (routine) without abnormal findings: Secondary | ICD-10-CM

## 2015-07-19 NOTE — Patient Instructions (Signed)

## 2015-07-19 NOTE — Progress Notes (Signed)
Patient ID: Lindsay Cook, female   DOB: Mar 26, 1951, 64 y.o.   MRN: [id number] 64 y.o. G0P0000 Widowed  Caucasian Fe here for annual exam.  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 Her specialist is moving to [place] and will be going there for care.  Patient's last menstrual period was 11/05/2002 (approximate).          Sexually active: No.  The current method of family planning is none.    Exercising: Yes.    walking 4 miles daily Smoker:  no  Health Maintenance: Pap:  04/12/14, Negative with neg HR HPV MMG:  01/24/15, 3D, Bi-Rads 1:  Negative Colonoscopy:  08/2010, repeat in 5 years, scheduled for 08/25/15 BMD:  Never TDaP:  2008 Labs:  Hematology/Oncology 05/2015   reports that she has never smoked. She has never used smokeless tobacco. She reports that she drinks alcohol. She reports that she does not use illicit drugs.  Past Medical History  Diagnosis Date  . 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 Cancer     WALDENSTROMS MACROGLOBULINEMIA  . Ventral hernia     Past Surgical History  Procedure Laterality Date  . Partial collectomy    . Appendectomy  1969  . Ventral hernia repair N/A 03/02/2013    Procedure: LAPAROSCOPIC VENTRAL HERNIA;  Surgeon: Odis Hollingshead, MD;  [facility];  Service: General;  Laterality: N/A;  . Insertion of mesh N/A 03/02/2013    Procedure: INSERTION OF MESH;  Surgeon: Odis Hollingshead, MD;  [facility];  Service: General;  Laterality: N/A;    Current Outpatient Prescriptions  Medication Sig Dispense Refill  . acetaminophen (TYLENOL) 500 MG tablet Take 500 mg by mouth every 6 (six) hours as needed for pain.      No current facility-administered medications for this visit.    Family History  Problem Relation Age of Onset  . Cancer Father     lung/brain  . Cancer Paternal Aunt     breast    ROS:  Pertinent items are noted in HPI.  Otherwise, a comprehensive ROS  was negative.  Exam:   BP 110/64 mmHg  Pulse 64  Ht 5' 2.25" (1.581 m)  Wt 136 lb (61.689 kg)  BMI 24.68 kg/m2  LMP 11/05/2002 (Approximate) Height: 5' 2.25" (158.1 cm) Ht Readings from Last 3 Encounters:  07/19/15 5' 2.25" (1.581 m)  06/09/14 5\' 4"  (1.626 m)  04/12/14 5\' 3"  (1.6 m)    General appearance: alert, cooperative and appears stated age Head: Normocephalic, without obvious abnormality, atraumatic Neck: no adenopathy, supple, symmetrical, trachea midline and thyroid normal to inspection and palpation Lungs: clear to auscultation bilaterally Breasts: normal appearance, no masses or tenderness Heart: regular rate and rhythm Abdomen: soft, non-tender; no masses,  no organomegaly Extremities: extremities normal, atraumatic, no cyanosis or edema Skin: Skin color, texture, turgor normal. No rashes or lesions Lymph nodes: Cervical, supraclavicular, and axillary nodes normal. No abnormal inguinal nodes palpated Neurologic: Grossly normal   Pelvic: External genitalia:  no lesions              Urethra:  normal appearing urethra with no masses, tenderness or lesions              Bartholin's and Skene's: normal                 Vagina: normal appearing vagina with normal color and discharge, no lesions  Cervix: anteverted              Pap taken: No. Bimanual Exam:  Uterus:  normal size, contour, position, consistency, mobility, non-tender              Adnexa: no mass, fullness, tenderness               Rectovaginal: Confirms               Anus:  normal sphincter tone, no lesions  Chaperone present: no  A:  Well Woman with normal exam  Postmenopausal S/P repair of ventral hernia 04/27/13 with mesh and has done well. Now sees hematologist at [facility] and new diagnosis of Waldenstrom macroglobulinemia or smoldering Myeloma   P:   Reviewed health and wellness pertinent to exam  Pap smear as above  Mammogram is due 01/2016  Counseled on diet  exercise, Mammo breast exam  return annually or prn  An After Visit Summary was printed and given to the patient.

## 2015-07-20 NOTE — Progress Notes (Signed)
Encounter reviewed by Dr. Brook Amundson C. Silva.  

## 2015-08-22 DIAGNOSIS — J329 Chronic sinusitis, unspecified: Secondary | ICD-10-CM | POA: Diagnosis not present

## 2015-08-29 DIAGNOSIS — J329 Chronic sinusitis, unspecified: Secondary | ICD-10-CM | POA: Diagnosis not present

## 2015-08-29 DIAGNOSIS — Z23 Encounter for immunization: Secondary | ICD-10-CM | POA: Diagnosis not present

## 2015-09-21 DIAGNOSIS — Z9049 Acquired absence of other specified parts of digestive tract: Secondary | ICD-10-CM | POA: Diagnosis not present

## 2015-09-21 DIAGNOSIS — Z8371 Family history of colonic polyps: Secondary | ICD-10-CM | POA: Diagnosis not present

## 2015-09-21 DIAGNOSIS — D123 Benign neoplasm of transverse colon: Secondary | ICD-10-CM | POA: Diagnosis not present

## 2015-09-21 DIAGNOSIS — D126 Benign neoplasm of colon, unspecified: Secondary | ICD-10-CM | POA: Diagnosis not present

## 2015-09-21 DIAGNOSIS — Z1211 Encounter for screening for malignant neoplasm of colon: Secondary | ICD-10-CM | POA: Diagnosis not present

## 2015-09-21 DIAGNOSIS — Z86010 Personal history of colonic polyps: Secondary | ICD-10-CM | POA: Diagnosis not present

## 2015-09-21 LAB — HM COLONOSCOPY

## 2015-10-19 DIAGNOSIS — C9000 Multiple myeloma not having achieved remission: Secondary | ICD-10-CM | POA: Diagnosis not present

## 2015-10-19 DIAGNOSIS — G35 Multiple sclerosis: Secondary | ICD-10-CM | POA: Diagnosis not present

## 2015-11-10 DIAGNOSIS — C9000 Multiple myeloma not having achieved remission: Secondary | ICD-10-CM | POA: Diagnosis not present

## 2015-12-15 ENCOUNTER — Encounter: Payer: Self-pay | Admitting: Family Medicine

## 2015-12-15 ENCOUNTER — Ambulatory Visit (INDEPENDENT_AMBULATORY_CARE_PROVIDER_SITE_OTHER): Payer: Medicare Other | Admitting: Family Medicine

## 2015-12-15 ENCOUNTER — Ambulatory Visit: Payer: Medicare Other | Admitting: Family Medicine

## 2015-12-15 VITALS — BP 106/68 | HR 74 | Ht 62.25 in | Wt 139.0 lb

## 2015-12-15 DIAGNOSIS — Z7189 Other specified counseling: Secondary | ICD-10-CM | POA: Diagnosis not present

## 2015-12-15 DIAGNOSIS — D472 Monoclonal gammopathy: Secondary | ICD-10-CM

## 2015-12-15 DIAGNOSIS — Z7689 Persons encountering health services in other specified circumstances: Secondary | ICD-10-CM

## 2015-12-15 DIAGNOSIS — C9000 Multiple myeloma not having achieved remission: Secondary | ICD-10-CM | POA: Diagnosis not present

## 2015-12-15 NOTE — Progress Notes (Signed)
   Subjective:    Patient ID: Lindsay Cook, female    DOB: [birth year], 65 y.o.   MRN: [id number]  HPI Chief Complaint  Patient presents with  . Establish Care   Goes by [facility]. She is here to establish care. Has not had a PCP in several years. Has no complaints today but would like to establish care with a PCP.  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 She is doing well. States she is healthy. Denies fever, chills, headache, dizziness, chest pain, cough, shortness of breath, GI or GU issues.   Had sinusitis in October 2016 and took Amoxicillin. She had GI bug in December 2016. Had a cold after christmas.  Oncologist Collier Salina Vororhees at [facility] in [place]. Gynecologist Dr. Shana Chute November 2016 and is due to in 5 years. Pap years. Flat polyp.  OB/GYN is [facility], [place] last pap smear was May 2016  Surgeon is todd Nurse, adult, partial colectomy hernia repair  Eye exam: 7-8 years.   Dentist up to date.   Mammogram is up to date Colonoscopy: UTD DEXA: never  prevnar last year, pneumovax 2 years ago Cannot have Zostavax Tdap 2007.   Lives alone. Husband passed 14 years ago. 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  Walks 4 miles a day.     Review of Systems Pertinent positives and negatives in the history of present illness.     Objective:   Physical Exam BP 106/68 mmHg  Pulse 74  Ht 5' 2.25" (1.581 m)  Wt 139 lb (63.05 kg)  BMI 25.22 kg/m2  SpO2 97%  LMP 11/05/2002 (Approximate)  Alert and in no distress.  Cardiac exam shows a regular sinus rhythm without murmurs or gallops. Lungs are clear to auscultation.       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 She is taking good care of herself and feels well. Discussed possible DEXA in near future since she has never had  one. She will follow up as needed or next year for physical. Will scan in recent lab work and office note from Dr. Melba Coon Hematologist on 10/19/2015.

## 2015-12-22 ENCOUNTER — Other Ambulatory Visit: Payer: Self-pay

## 2015-12-22 DIAGNOSIS — Z1231 Encounter for screening mammogram for malignant neoplasm of breast: Secondary | ICD-10-CM

## 2016-01-26 ENCOUNTER — Ambulatory Visit: Payer: Medicare Other

## 2016-02-03 ENCOUNTER — Ambulatory Visit
Admission: RE | Admit: 2016-02-03 | Discharge: 2016-02-03 | Disposition: A | Discharge status: Home / Self Care | Payer: Medicare Other | Source: Ambulatory Visit

## 2016-02-03 DIAGNOSIS — Z1231 Encounter for screening mammogram for malignant neoplasm of breast: Secondary | ICD-10-CM

## 2016-04-18 DIAGNOSIS — D472 Monoclonal gammopathy: Secondary | ICD-10-CM | POA: Diagnosis not present

## 2016-04-18 DIAGNOSIS — C9000 Multiple myeloma not having achieved remission: Secondary | ICD-10-CM | POA: Diagnosis not present

## 2016-04-18 DIAGNOSIS — R6889 Other general symptoms and signs: Secondary | ICD-10-CM | POA: Diagnosis not present

## 2016-05-03 DIAGNOSIS — C9000 Multiple myeloma not having achieved remission: Secondary | ICD-10-CM | POA: Diagnosis not present

## 2016-07-25 ENCOUNTER — Ambulatory Visit (INDEPENDENT_AMBULATORY_CARE_PROVIDER_SITE_OTHER): Payer: Medicare Other | Admitting: Medical

## 2016-07-25 ENCOUNTER — Encounter: Payer: Self-pay | Admitting: Medical

## 2016-07-25 VITALS — BP 102/60 | HR 68 | Temp 98.6°F | Resp 18 | Wt 139.8 lb

## 2016-07-25 DIAGNOSIS — Z23 Encounter for immunization: Secondary | ICD-10-CM

## 2016-07-25 DIAGNOSIS — W5503XA Scratched by cat, initial encounter: Secondary | ICD-10-CM

## 2016-07-25 DIAGNOSIS — T148 Other injury of unspecified body region: Secondary | ICD-10-CM

## 2016-07-25 DIAGNOSIS — L089 Local infection of the skin and subcutaneous tissue, unspecified: Secondary | ICD-10-CM

## 2016-07-25 MED ORDER — AZITHROMYCIN 250 MG PO TABS: ORAL_TABLET | ORAL | 0 refills | Status: DC

## 2016-07-25 NOTE — Addendum Note (Signed)
Addended by: Arley Phenix L on: 07/25/2016 03:49 PM   Modules accepted: Orders

## 2016-07-25 NOTE — Progress Notes (Signed)
Subjective: Chief Complaint  Patient presents with  . Animal Bite    Red forearm.Reports has taken old script of Amox 875mg  daily for 7 days. Puncture marks on arm from where the cat hit her with a claw,    Here for concerns about animal issue.  She and her cat were out in the yard a week ago when a dog came angrily at her cat.  She went to pick up her cat, and cat scratched her arms.  Since then most of the wounds have improved, but she has one wound of right forearm that has swollen and has been red.   Been using soap and water, neosporin topically and last night got a little pus out of the wound right arm.  finished the 6 days of amoxicilin she had.  No fever, no worse swelling or redness. No cat bite.   No dog bite.  Her cat is up to date on vaccines.   Her cat also went to vet yesterday.  No other aggravating or relieving factors. No other complaint.   Past Medical History:  Diagnosis Date  . Cancer ([place])    WALDENSTROMS MACROGLOBULINEMIA  . 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 Ventral hernia    No current outpatient prescriptions on file prior to visit.   No current facility-administered medications on file prior to visit.     ROS as in subjective   Objective: BP 102/60   Pulse 68   Temp 98.6 F (37 C) (Oral)   Resp 18   Wt 139 lb 12.8 oz (63.4 kg)   LMP 11/05/2002 (Approximate)   BMI 25.36 kg/m   Gen: wd, wn, nad Skin: right forearm mid shaft with 2 scratch wounds superficial, one is slightly raised 0.5cm raised area that is slightly tender.  No induration, no fluctuance.  Healing scratch/abrasion wounds of left forearm.   arms neurovascularly intact   Assessment: Encounter Diagnoses  Name Primary?  . Cat scratch Yes  . Skin infection   . Need for prophylactic vaccination and inoculation against influenza   . Need for Tdap vaccination     Plan: Cat scratch, skin infection - she finished 6 days of amoxicillin.  Begin zpak, warm  compresses, keep wound clean with soap and water.   If not resolved within 4-5 days then recheck.    Counseled on the influenza virus vaccine.  Vaccine information sheet given.  Influenza vaccine given after consent obtained.  Counseled on the Tdap (tetanus, diptheria, and acellular pertussis) vaccine.  Vaccine information sheet given. Tdap vaccine given after consent obtained.  Lindsay Cook was seen today for animal bite.  Diagnoses and all orders for this visit:  Cat scratch  Skin infection  Need for prophylactic vaccination and inoculation against influenza -     Flu vaccine HIGH DOSE PF (Fluzone High dose)  Need for Tdap vaccination  Other orders -     azithromycin (ZITHROMAX) 250 MG tablet; 2 tablets day 1, then 1 tablet days 2-4

## 2016-08-27 ENCOUNTER — Encounter: Payer: Self-pay | Admitting: Nurse Practitioner

## 2016-08-27 ENCOUNTER — Ambulatory Visit (INDEPENDENT_AMBULATORY_CARE_PROVIDER_SITE_OTHER): Payer: Medicare Other | Admitting: Nurse Practitioner

## 2016-08-27 VITALS — BP 108/66 | HR 60 | Ht 62.25 in | Wt 139.0 lb

## 2016-08-27 DIAGNOSIS — E2839 Other primary ovarian failure: Secondary | ICD-10-CM | POA: Diagnosis not present

## 2016-08-27 DIAGNOSIS — Z01419 Encounter for gynecological examination (general) (routine) without abnormal findings: Secondary | ICD-10-CM

## 2016-08-27 DIAGNOSIS — D472 Monoclonal gammopathy: Secondary | ICD-10-CM

## 2016-08-27 NOTE — Progress Notes (Signed)
Patient ID: Lindsay Cook, female   DOB: Dec 21, 1950, 65 y.o.   MRN: [id number]  65 y.o. G0P0000 Widowed  Caucasian Fe here for annual exam.  Now going to [place] every 6 months instead of every 3 months for follow up on MGUS (monoclonal gammopathy of unknown significance) and smoldering Myeloma.   10/2015 had a full body scan that was negative for lesions.  She is due to repeat end of December.  She is well otherwise with no complaints.  She is getting phone calls or emails about mesh surgery that she had done in 2014 for repair of incisional hernia.  She just wants to make sure no evidence of a problem.  Patient's last menstrual period was 11/05/2002 (approximate).          Sexually active: No.  The current method of family planning is abstinence.    Exercising: Yes.    walking about 4 miles per day Smoker:  no  Health Maintenance: Pap:  04/12/14, Negative with neg HR HPV MMG: 02/03/16, 3D, Bi-Rads 1:  Negative Colonoscopy:  08/25/15, benign polyp, repeat in 5 years BMD:  Never TDaP: 07/25/16 Shingles: Not a candidate Pneumonia: 09/20/14 Prevnar 13, 01/03/15 Pneumovax Hep C: 04/13/10 Labs: Oncologist takes are of all labs   reports that she has never smoked. She has never used smokeless tobacco. She reports that she drinks alcohol. She reports that she does not use drugs.  Past Medical History:  Diagnosis Date  . Cancer ([place])    WALDENSTROMS MACROGLOBULINEMIA  . 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 Ventral hernia     Past Surgical History:  Procedure Laterality Date  . APPENDECTOMY  1969  . COLECTOMY     Partial 2011  . HERNIA REPAIR     2014  . INSERTION OF MESH N/A 03/02/2013   Procedure: INSERTION OF MESH;  Surgeon: Odis Hollingshead, MD;  [facility];  Service: General;  Laterality: N/A;  . partial collectomy    . VENTRAL HERNIA REPAIR N/A 03/02/2013   Procedure: LAPAROSCOPIC VENTRAL HERNIA;  Surgeon: Odis Hollingshead, MD;  [facility];  Service: General;  Laterality: N/A;    Current Outpatient Prescriptions  Medication Sig Dispense Refill  . azithromycin (ZITHROMAX) 250 MG tablet 2 tablets day 1, then 1 tablet days 2-4 6 tablet 0   No current facility-administered medications for this visit.     Family History  Problem Relation Age of Onset  . Cancer Father     lung/brain  . Cancer Paternal Aunt     breast    ROS:  Pertinent items are noted in HPI.  Otherwise, a comprehensive ROS was negative.  Exam:   LMP 11/05/2002 (Approximate)    Ht Readings from Last 3 Encounters:  12/15/15 5' 2.25" (1.581 m)  07/19/15 5' 2.25" (1.581 m)  06/09/14 5\' 4"  (1.626 m)    General appearance: alert, cooperative and appears stated age Head: Normocephalic, without obvious abnormality, atraumatic Neck: no adenopathy, supple, symmetrical, trachea midline and thyroid normal to inspection and palpation Lungs: clear to auscultation bilaterally Breasts: normal appearance, no masses or tenderness Heart: regular rate and rhythm Abdomen: soft, non-tender; no masses,  no organomegaly, no  Signs of mesh protrusion or tenderness. Extremities: extremities normal, atraumatic, no cyanosis or edema Skin: Skin color, texture, turgor normal. No rashes or lesions Lymph nodes: Cervical, supraclavicular, and axillary nodes normal. No abnormal inguinal nodes palpated Neurologic: Grossly normal   Pelvic: External genitalia:  no lesions  Urethra:  normal appearing urethra with no masses, tenderness or lesions              Bartholin's and Skene's: normal                 Vagina: normal appearing vagina with normal color and discharge, no lesions              Cervix: anteverted              Pap taken: No. Bimanual Exam:  Uterus:  normal size, contour, position, consistency, mobility, non-tender              Adnexa: no mass, fullness, tenderness               Rectovaginal: Confirms               Anus:  normal sphincter tone, no  lesions  Chaperone present: no  A:  Well Woman with normal exam       Postmenopausal S/P repair of ventral hernia 04/27/13 with mesh and has done well. Now sees hematologist at [facility] and new diagnosis of Waldenstrom macroglobulinemia or smoldering Myeloma    P:   Reviewed health and wellness pertinent to exam  Pap smear as above  Mammogram is due 3/18  Will get BMD as baseline  Counseled on breast self exam, mammography screening, adequate intake of calcium and vitamin D, diet and exercise, Kegel's exercises return annually or prn  An After Visit Summary was printed and given to the patient.

## 2016-08-27 NOTE — Patient Instructions (Signed)

## 2016-08-31 NOTE — Progress Notes (Signed)
Encounter reviewed by Dr. Brook Amundson C. Silva.  

## 2016-10-22 DIAGNOSIS — D472 Monoclonal gammopathy: Secondary | ICD-10-CM | POA: Diagnosis not present

## 2016-10-22 DIAGNOSIS — C9000 Multiple myeloma not having achieved remission: Secondary | ICD-10-CM | POA: Diagnosis not present

## 2016-10-22 DIAGNOSIS — R6889 Other general symptoms and signs: Secondary | ICD-10-CM | POA: Diagnosis not present

## 2016-11-15 DIAGNOSIS — C9000 Multiple myeloma not having achieved remission: Secondary | ICD-10-CM | POA: Diagnosis not present

## 2017-01-02 ENCOUNTER — Other Ambulatory Visit: Payer: Self-pay | Admitting: Nurse Practitioner

## 2017-01-02 DIAGNOSIS — Z1231 Encounter for screening mammogram for malignant neoplasm of breast: Secondary | ICD-10-CM

## 2017-02-13 ENCOUNTER — Ambulatory Visit
Admission: RE | Admit: 2017-02-13 | Discharge: 2017-02-13 | Disposition: A | Discharge status: Home / Self Care | Payer: Medicare Other | Source: Ambulatory Visit | Attending: Nurse Practitioner | Admitting: Nurse Practitioner

## 2017-02-13 DIAGNOSIS — Z780 Asymptomatic menopausal state: Secondary | ICD-10-CM | POA: Diagnosis not present

## 2017-02-13 DIAGNOSIS — M8589 Other specified disorders of bone density and structure, multiple sites: Secondary | ICD-10-CM | POA: Diagnosis not present

## 2017-02-13 DIAGNOSIS — Z1231 Encounter for screening mammogram for malignant neoplasm of breast: Secondary | ICD-10-CM | POA: Diagnosis not present

## 2017-02-13 DIAGNOSIS — E2839 Other primary ovarian failure: Secondary | ICD-10-CM

## 2017-04-23 DIAGNOSIS — C9000 Multiple myeloma not having achieved remission: Secondary | ICD-10-CM | POA: Diagnosis not present

## 2017-04-23 DIAGNOSIS — R6889 Other general symptoms and signs: Secondary | ICD-10-CM | POA: Diagnosis not present

## 2017-04-23 DIAGNOSIS — D472 Monoclonal gammopathy: Secondary | ICD-10-CM | POA: Diagnosis not present

## 2017-05-02 DIAGNOSIS — C9000 Multiple myeloma not having achieved remission: Secondary | ICD-10-CM | POA: Diagnosis not present

## 2017-08-08 ENCOUNTER — Other Ambulatory Visit (INDEPENDENT_AMBULATORY_CARE_PROVIDER_SITE_OTHER): Payer: Medicare Other

## 2017-08-08 DIAGNOSIS — Z23 Encounter for immunization: Secondary | ICD-10-CM

## 2017-09-12 ENCOUNTER — Ambulatory Visit: Payer: Medicare Other | Admitting: Certified Nurse Midwife

## 2017-10-10 ENCOUNTER — Ambulatory Visit: Payer: Medicare Other | Admitting: Certified Nurse Midwife

## 2017-11-11 DIAGNOSIS — C9000 Multiple myeloma not having achieved remission: Secondary | ICD-10-CM | POA: Diagnosis not present

## 2017-11-11 DIAGNOSIS — M4185 Other forms of scoliosis, thoracolumbar region: Secondary | ICD-10-CM | POA: Diagnosis not present

## 2017-11-21 DIAGNOSIS — C9000 Multiple myeloma not having achieved remission: Secondary | ICD-10-CM | POA: Diagnosis not present

## 2017-11-28 ENCOUNTER — Ambulatory Visit: Payer: Medicare Other | Admitting: Certified Nurse Midwife

## 2018-01-21 ENCOUNTER — Ambulatory Visit: Payer: Medicare Other | Admitting: Certified Nurse Midwife

## 2018-01-21 ENCOUNTER — Other Ambulatory Visit: Payer: Self-pay | Admitting: Certified Nurse Midwife

## 2018-01-21 DIAGNOSIS — Z1231 Encounter for screening mammogram for malignant neoplasm of breast: Secondary | ICD-10-CM

## 2018-02-11 ENCOUNTER — Ambulatory Visit: Payer: Medicare Other | Admitting: Certified Nurse Midwife

## 2018-02-18 ENCOUNTER — Ambulatory Visit: Payer: Medicare Other

## 2018-03-07 ENCOUNTER — Ambulatory Visit
Admission: RE | Admit: 2018-03-07 | Discharge: 2018-03-07 | Disposition: A | Discharge status: Home / Self Care | Payer: Medicare Other | Source: Ambulatory Visit | Attending: Certified Nurse Midwife | Admitting: Certified Nurse Midwife

## 2018-03-07 DIAGNOSIS — Z1231 Encounter for screening mammogram for malignant neoplasm of breast: Secondary | ICD-10-CM | POA: Diagnosis not present

## 2018-04-09 ENCOUNTER — Ambulatory Visit: Payer: Medicare Other | Admitting: Certified Nurse Midwife

## 2018-04-16 ENCOUNTER — Ambulatory Visit: Payer: Medicare Other | Admitting: Certified Nurse Midwife

## 2018-04-23 ENCOUNTER — Telehealth: Payer: Self-pay

## 2018-04-23 NOTE — Telephone Encounter (Signed)
Pt does not want to come in for awv. Per pt she sees a lot of doctor right now. [place]

## 2018-04-29 ENCOUNTER — Ambulatory Visit (INDEPENDENT_AMBULATORY_CARE_PROVIDER_SITE_OTHER): Payer: Medicare Other | Admitting: Family Medicine

## 2018-04-29 ENCOUNTER — Encounter: Payer: Self-pay | Admitting: Family Medicine

## 2018-04-29 VITALS — BP 102/68 | HR 84 | Temp 98.0°F | Wt 137.2 lb

## 2018-04-29 DIAGNOSIS — B029 Zoster without complications: Secondary | ICD-10-CM

## 2018-04-29 DIAGNOSIS — C9000 Multiple myeloma not having achieved remission: Secondary | ICD-10-CM

## 2018-04-29 DIAGNOSIS — D472 Monoclonal gammopathy: Secondary | ICD-10-CM

## 2018-04-29 MED ORDER — VALACYCLOVIR HCL 1 G PO TABS: 1000.0000 mg | ORAL_TABLET | Freq: Two times a day (BID) | ORAL | 0 refills | Status: DC

## 2018-04-29 NOTE — Progress Notes (Signed)
   Subjective:    Patient ID: Lindsay Cook, female    DOB: [birth year], 67 y.o.   MRN: [id number]  HPI She is here for evaluation of the rash.  She first noted some pain in the left mid thoracic area underneath her breasts last Thursday and the rash started 2 days ago on Sunday.  She has a previous history of shingles.  She also has a history of multiple myeloma which seems to be causing very little difficulty at the present time.  She does see her oncologist regularly.   Review of Systems     Objective:   Physical Exam Alert and in no distress.  Erythematous vesicular  lesions noted underneath the left breast.  It is so far localized to a 3 cm area.       Assessment & Plan:  Herpes zoster without complication - Plan: valACYclovir (VALTREX) 1000 MG tablet  Smoldering multiple myeloma ([place]) Since she has a previous history of this she has a good idea how to handle this.  I will give her Valtrex.  She was not interested in steroids as they do cause CNS side effects.  Information given concerning this.  Discussed being around pregnant women and small children.  Once the rash has gone away and there is scabbing, she should no longer be infectious.  Cautioned her to call if she has any questions.  Offered more pain meds but she was not interested.

## 2018-04-29 NOTE — Patient Instructions (Signed)
Shingles Shingles, which is also known as herpes zoster, is an infection that causes a painful skin rash and fluid-filled blisters. Shingles is not related to genital herpes, which is a sexually transmitted infection. Shingles only develops in people who:  Have had chickenpox.  Have received the chickenpox vaccine. (This is rare.)  What are the causes? Shingles is caused by varicella-zoster virus (VZV). This is the same virus that causes chickenpox. After exposure to VZV, the virus stays in the body in an inactive (dormant) state. Shingles develops if the virus reactivates. This can happen many years after the initial exposure to VZV. It is not known what causes this virus to reactivate. What increases the risk? People who have had chickenpox or received the chickenpox vaccine are at risk for shingles. Infection is more common in people who:  Are older than age 50.  Have a weakened defense (immune) system, such as those with HIV, AIDS, or cancer.  Are taking medicines that weaken the immune system, such as transplant medicines.  Are under great stress.  What are the signs or symptoms? Early symptoms of this condition include itching, tingling, and pain in an area on your skin. Pain may be described as burning, stabbing, or throbbing. A few days or weeks after symptoms start, a painful red rash appears, usually on one side of the body in a bandlike or beltlike pattern. The rash eventually turns into fluid-filled blisters that break open, scab over, and dry up in about 2-3 weeks. At any time during the infection, you may also develop:  A fever.  Chills.  A headache.  An upset stomach.  How is this diagnosed? This condition is diagnosed with a skin exam. Sometimes, skin or fluid samples are taken from the blisters before a diagnosis is made. These samples are examined under a microscope or sent to a lab for testing. How is this treated? There is no specific cure for this condition.  Your health care provider will probably prescribe medicines to help you manage pain, recover more quickly, and avoid long-term problems. Medicines may include:  Antiviral drugs.  Anti-inflammatory drugs.  Pain medicines.  If the area involved is on your face, you may be referred to a specialist, such as an eye doctor (ophthalmologist) or an ear, nose, and throat (ENT) doctor to help you avoid eye problems, chronic pain, or disability. Follow these instructions at home: Medicines  Take medicines only as directed by your health care provider.  Apply an anti-itch or numbing cream to the affected area as directed by your health care provider. Blister and Rash Care  Take a cool bath or apply cool compresses to the area of the rash or blisters as directed by your health care provider. This may help with pain and itching.  Keep your rash covered with a loose bandage (dressing). Wear loose-fitting clothing to help ease the pain of material rubbing against the rash.  Keep your rash and blisters clean with mild soap and cool water or as directed by your health care provider.  Check your rash every day for signs of infection. These include redness, swelling, and pain that lasts or increases.  Do not pick your blisters.  Do not scratch your rash. General instructions  Rest as directed by your health care provider.  Keep all follow-up visits as directed by your health care provider. This is important.  Until your blisters scab over, your infection can cause chickenpox in people who have never had it or been vaccinated   against it. To prevent this from happening, avoid contact with other people, especially: ? Babies. ? Pregnant women. ? Children who have eczema. ? Elderly people who have transplants. ? People who have chronic illnesses, such as leukemia or AIDS. Contact a health care provider if:  Your pain is not relieved with prescribed medicines.  Your pain does not get better after  the rash heals.  Your rash looks infected. Signs of infection include redness, swelling, and pain that lasts or increases. Get help right away if:  The rash is on your face or nose.  You have facial pain, pain around your eye area, or loss of feeling on one side of your face.  You have ear pain or you have ringing in your ear.  You have loss of taste.  Your condition gets worse. This information is not intended to replace advice given to you by your health care provider. Make sure you discuss any questions you have with your health care provider. Document Released: 10/22/2005 Document Revised: 06/17/2016 Document Reviewed: 09/02/2014 Elsevier Interactive Patient Education  [employer]. use the Tylenol and then if that does not work he can take 2 Aleve twice per day if you need something more than that call me

## 2018-04-30 ENCOUNTER — Telehealth: Payer: Self-pay | Admitting: Family Medicine

## 2018-04-30 NOTE — Telephone Encounter (Signed)
Pt would like to know if 800 ibuprofen instead . Please advise  [facility]

## 2018-04-30 NOTE — Telephone Encounter (Signed)
Pt states Aleve isn't touching the pain, would like something else for pain, not "wild" about a Narcotic but wants to know what you think and call her back

## 2018-04-30 NOTE — Telephone Encounter (Signed)
I think it is time for codeine

## 2018-04-30 NOTE — Telephone Encounter (Signed)
800 mg 3 times per day.  Explain to her the dates for of the ibuprofen tablets 3 times per day.  She can also take Tylenol and regular doses as well

## 2018-04-30 NOTE — Telephone Encounter (Signed)
Pt is aware and says thank you

## 2018-05-20 DIAGNOSIS — D472 Monoclonal gammopathy: Secondary | ICD-10-CM | POA: Diagnosis not present

## 2018-05-20 DIAGNOSIS — R6889 Other general symptoms and signs: Secondary | ICD-10-CM | POA: Diagnosis not present

## 2018-05-20 DIAGNOSIS — C9000 Multiple myeloma not having achieved remission: Secondary | ICD-10-CM | POA: Diagnosis not present

## 2018-05-29 DIAGNOSIS — C9000 Multiple myeloma not having achieved remission: Secondary | ICD-10-CM | POA: Diagnosis not present

## 2018-08-14 ENCOUNTER — Other Ambulatory Visit (INDEPENDENT_AMBULATORY_CARE_PROVIDER_SITE_OTHER): Payer: Medicare Other

## 2018-08-14 DIAGNOSIS — Z23 Encounter for immunization: Secondary | ICD-10-CM

## 2018-11-12 DIAGNOSIS — D472 Monoclonal gammopathy: Secondary | ICD-10-CM | POA: Diagnosis not present

## 2018-11-12 DIAGNOSIS — C9000 Multiple myeloma not having achieved remission: Secondary | ICD-10-CM | POA: Diagnosis not present

## 2018-11-12 DIAGNOSIS — R6889 Other general symptoms and signs: Secondary | ICD-10-CM | POA: Diagnosis not present

## 2018-11-20 DIAGNOSIS — C9000 Multiple myeloma not having achieved remission: Secondary | ICD-10-CM | POA: Diagnosis not present

## 2019-03-10 ENCOUNTER — Other Ambulatory Visit: Payer: Self-pay | Admitting: Certified Nurse Midwife

## 2019-03-10 DIAGNOSIS — Z1231 Encounter for screening mammogram for malignant neoplasm of breast: Secondary | ICD-10-CM

## 2019-05-13 DIAGNOSIS — C9000 Multiple myeloma not having achieved remission: Secondary | ICD-10-CM | POA: Diagnosis not present

## 2019-05-13 DIAGNOSIS — R6889 Other general symptoms and signs: Secondary | ICD-10-CM | POA: Diagnosis not present

## 2019-05-13 DIAGNOSIS — D472 Monoclonal gammopathy: Secondary | ICD-10-CM | POA: Diagnosis not present

## 2019-05-19 ENCOUNTER — Ambulatory Visit: Payer: Medicare Other

## 2019-05-28 DIAGNOSIS — C9000 Multiple myeloma not having achieved remission: Secondary | ICD-10-CM | POA: Diagnosis not present

## 2019-06-17 ENCOUNTER — Ambulatory Visit
Admission: RE | Admit: 2019-06-17 | Discharge: 2019-06-17 | Disposition: A | Discharge status: Home / Self Care | Payer: Medicare Other | Source: Ambulatory Visit | Attending: Certified Nurse Midwife | Admitting: Certified Nurse Midwife

## 2019-06-17 ENCOUNTER — Other Ambulatory Visit: Payer: Self-pay

## 2019-06-17 DIAGNOSIS — Z1231 Encounter for screening mammogram for malignant neoplasm of breast: Secondary | ICD-10-CM

## 2019-07-08 ENCOUNTER — Other Ambulatory Visit: Payer: Medicare Other

## 2019-07-08 ENCOUNTER — Other Ambulatory Visit: Payer: Self-pay

## 2019-07-20 ENCOUNTER — Other Ambulatory Visit: Payer: Self-pay

## 2019-07-20 ENCOUNTER — Telehealth: Payer: Self-pay | Admitting: Medical

## 2019-07-20 ENCOUNTER — Ambulatory Visit (INDEPENDENT_AMBULATORY_CARE_PROVIDER_SITE_OTHER): Payer: Medicare Other | Admitting: Medical

## 2019-07-20 ENCOUNTER — Encounter: Payer: Self-pay | Admitting: Medical

## 2019-07-20 VITALS — BP 122/70 | HR 64 | Temp 98.2°F | Ht 63.0 in | Wt 149.4 lb

## 2019-07-20 DIAGNOSIS — Z23 Encounter for immunization: Secondary | ICD-10-CM

## 2019-07-20 DIAGNOSIS — S0083XA Contusion of other part of head, initial encounter: Secondary | ICD-10-CM

## 2019-07-20 DIAGNOSIS — S0993XA Unspecified injury of face, initial encounter: Secondary | ICD-10-CM | POA: Diagnosis not present

## 2019-07-20 NOTE — Telephone Encounter (Signed)
Please call back and asked when her last tetanus booster was?  When I look back in her chart I do not see a tetanus vaccine on file.  Technically she had a small laceration that was healing just fine.  But anytime you have a laceration or puncture of skin, that would be a time to update tetanus booster.  Sometimes insurance can be funny on paying for this but she may want to call and check your insurance about coverage for tetanus booster for laceration.  I recommend she come in and have a tetanus booster Td if she has not had one in the last 5 or 7 years

## 2019-07-20 NOTE — Telephone Encounter (Signed)
lmom of detailed message for patient to all about her tetanus shot

## 2019-07-20 NOTE — Progress Notes (Signed)
Subjective: Chief Complaint  Patient presents with  . Eye Problem    bruised eye-trash can lid hit her near right eyebrow area and she woke up with bruised eye after icing it    Here for bruising of right face and eye.  2 days ago on 07/18/2019, she was cleaning her trash cans.  Her large blue recycle bin has a lid that opens up at 90 degrees and does not fold all the way back.  While she was cleaning the trash can the lid slammed down against her right forehead.  The trauma made a small cut.  She cleaned the area and she used ice pack a few times before today.  She has purplish bruising that has progressively changed from forehead to around her eye on the right.  She denies any vision changes no blurred vision, no numbness or tingling of the face.  She is tender mainly where the trash can lid hit.  No other aggravating or relieving factors.  She notes history of shingles infection 2 times in the past, last time at least 3 years ago.  She has questions about vaccination.   No other complaint.  Past Medical History:  Diagnosis Date  . 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 Ventral hernia    Current Outpatient Medications on File Prior to Visit  Medication Sig Dispense Refill  . LORazepam (ATIVAN) 0.5 MG tablet     . valACYclovir (VALTREX) 1000 MG tablet Take 1 tablet (1,000 mg total) by mouth 2 (two) times daily. 20 tablet 0   No current facility-administered medications on file prior to visit.    ROS as in subjective   Objective: BP 122/70   Pulse 64   Temp 98.2 F (36.8 C)   Ht 5\' 3"  (1.6 m)   Wt 149 lb 6.4 oz (67.8 kg)   LMP 11/05/2002 (Approximate)   SpO2 98%   BMI 26.47 kg/m   General well-developed well-nourished no acute distress, white female There is purplish yellow bruising of the right lateral forehead and a small healing 1 cm laceration.  The upper and lower eyelids both have purplish  bruising and a localized swollen bruised area of the medial and lateral eyelids. She is tender over the right forehead at the site of the small laceration otherwise nontender Facial motions intact, motor function of facial muscles seem intact, no asymmetry, EOM intact.  Visual acuity normal corrected with lenses.   Visual field defects.  No decreased sensation of the face. TMs and ears normal appearing, nares normal with no occlusion.  Rest of face nontender without deformity   Assessment: Encounter Diagnoses  Name Primary?  . Bruise of face, initial encounter Yes  . Blunt trauma of face, initial encounter   . Need for shingles vaccine     Plan: We discussed her case, and supervising physician Dr. Jill Alexanders also examined patient.  Reassured no apparent facial fracture.  She has localized bruising tenderness.  Advise she keep the area clean with soap and water, advise she use bag of frozen peas with cloth in between the ice and her skin, 20 minutes on 20 minutes off the next few days.  She can continue ibuprofen several more days to help with pain and inflammation.  Advised that the bruising will gradually resolve.  Gave prescription for shingles vaccine to take to the pharmacy  Lexxie was seen today for eye problem.  Diagnoses and all  orders for this visit:  Bruise of face, initial encounter  Blunt trauma of face, initial encounter  Need for shingles vaccine

## 2019-09-01 ENCOUNTER — Encounter: Payer: Self-pay | Admitting: Family Medicine

## 2019-11-18 DIAGNOSIS — R6889 Other general symptoms and signs: Secondary | ICD-10-CM | POA: Diagnosis not present

## 2019-11-18 DIAGNOSIS — Z0389 Encounter for observation for other suspected diseases and conditions ruled out: Secondary | ICD-10-CM | POA: Diagnosis not present

## 2019-11-18 DIAGNOSIS — D472 Monoclonal gammopathy: Secondary | ICD-10-CM | POA: Diagnosis not present

## 2019-11-18 DIAGNOSIS — C9000 Multiple myeloma not having achieved remission: Secondary | ICD-10-CM | POA: Diagnosis not present

## 2019-11-26 DIAGNOSIS — C9000 Multiple myeloma not having achieved remission: Secondary | ICD-10-CM | POA: Diagnosis not present

## 2019-11-30 DIAGNOSIS — L570 Actinic keratosis: Secondary | ICD-10-CM | POA: Diagnosis not present

## 2019-11-30 DIAGNOSIS — L439 Lichen planus, unspecified: Secondary | ICD-10-CM | POA: Diagnosis not present

## 2019-11-30 DIAGNOSIS — L218 Other seborrheic dermatitis: Secondary | ICD-10-CM | POA: Diagnosis not present

## 2019-11-30 DIAGNOSIS — D225 Melanocytic nevi of trunk: Secondary | ICD-10-CM | POA: Diagnosis not present

## 2019-11-30 DIAGNOSIS — D485 Neoplasm of uncertain behavior of skin: Secondary | ICD-10-CM | POA: Diagnosis not present

## 2019-11-30 DIAGNOSIS — L821 Other seborrheic keratosis: Secondary | ICD-10-CM | POA: Diagnosis not present

## 2019-12-01 ENCOUNTER — Ambulatory Visit: Payer: Medicare Other

## 2019-12-10 ENCOUNTER — Ambulatory Visit: Payer: Medicare Other | Attending: Internal Medicine

## 2019-12-10 DIAGNOSIS — Z23 Encounter for immunization: Secondary | ICD-10-CM | POA: Insufficient documentation

## 2019-12-10 NOTE — Progress Notes (Signed)
   Covid-19 Vaccination Clinic  Name:  Lindsay Cook    MRN: [id number] DOB: 01/27/1951  12/10/2019  [name] was observed post Covid-19 immunization for 15 minutes without incidence. She was provided with Vaccine Information Sheet and instruction to access the V-Safe system.   [name] was instructed to call 911 with any severe reactions post vaccine: Marland Kitchen Difficulty breathing  . Swelling of your face and throat  . A fast heartbeat  . A bad rash all over your body  . Dizziness and weakness    Immunizations Administered    Name Date Dose VIS Date Route   Pfizer COVID-19 Vaccine 12/10/2019  9:07 AM 0.3 mL 10/16/2019 Intramuscular   Manufacturer: [place]   Lot: [id number]   [place]: [id number]

## 2019-12-22 ENCOUNTER — Ambulatory Visit: Payer: Medicare Other

## 2020-01-04 ENCOUNTER — Ambulatory Visit: Payer: Medicare Other | Attending: Internal Medicine

## 2020-01-04 DIAGNOSIS — Z23 Encounter for immunization: Secondary | ICD-10-CM | POA: Insufficient documentation

## 2020-01-04 NOTE — Progress Notes (Signed)
   Covid-19 Vaccination Clinic  Name:  Lindsay Cook    MRN: [id number] DOB: 1951/01/16  01/04/2020  [name] was observed post Covid-19 immunization for 15 minutes without incidence. She was provided with Vaccine Information Sheet and instruction to access the V-Safe system.   [name] was instructed to call 911 with any severe reactions post vaccine: Marland Kitchen Difficulty breathing  . Swelling of your face and throat  . A fast heartbeat  . A bad rash all over your body  . Dizziness and weakness    Immunizations Administered    Name Date Dose VIS Date Route   Pfizer COVID-19 Vaccine 01/04/2020  1:22 PM 0.3 mL 10/16/2019 Intramuscular   Manufacturer: [place]   Lot: [id number]   [place]: [id number]

## 2020-01-20 DIAGNOSIS — D472 Monoclonal gammopathy: Secondary | ICD-10-CM | POA: Diagnosis not present

## 2020-01-20 DIAGNOSIS — C9000 Multiple myeloma not having achieved remission: Secondary | ICD-10-CM | POA: Diagnosis not present

## 2020-01-22 ENCOUNTER — Other Ambulatory Visit: Payer: Self-pay

## 2020-01-22 ENCOUNTER — Ambulatory Visit (INDEPENDENT_AMBULATORY_CARE_PROVIDER_SITE_OTHER): Payer: Medicare Other | Admitting: Family Medicine

## 2020-01-22 ENCOUNTER — Encounter: Payer: Self-pay | Admitting: Family Medicine

## 2020-01-22 VITALS — Temp 96.6°F | Wt 145.0 lb

## 2020-01-22 DIAGNOSIS — H00022 Hordeolum internum right lower eyelid: Secondary | ICD-10-CM | POA: Diagnosis not present

## 2020-01-22 DIAGNOSIS — H0012 Chalazion right lower eyelid: Secondary | ICD-10-CM | POA: Diagnosis not present

## 2020-01-22 MED ORDER — ERYTHROMYCIN 5 MG/GM OP OINT: 1.0000 "application " | TOPICAL_OINTMENT | Freq: Every day | OPHTHALMIC | 0 refills | Status: DC

## 2020-01-22 NOTE — Progress Notes (Signed)
   Subjective:  Documentation for virtual audio and video telecommunications through [place] encounter:  The patient was located at home. 2 patient identifiers used.  The provider was located in the office. The patient did consent to this visit and is aware of possible charges through their insurance for this visit.  The other persons participating in this telemedicine service were none.    Patient ID: Lindsay Cook, female    DOB: 1951-03-18, 69 y.o.   MRN: [id number]  HPI Chief Complaint  Patient presents with  . stye    woke up yesterday with stye- corner of right eye.    Complains of a 2 day history of a stye on the inside of her right lower lid. Denies having this in the past.  No eye pain but has swelling and tenderness to the outer lower lid.  Does not wear contact lenses.   Does not have an eye doctor.   No fever, chills, N/V/D.    Review of Systems Pertinent positives and negatives in the history of present illness.     Objective:   Physical Exam Temp (!) 96.6 F (35.9 C)   Wt 145 lb (65.8 kg)   LMP 11/05/2002 (Approximate)   BMI 25.69 kg/m   Alert and in no acute distress. Right eye with mild lower lid edema, red and white bump inside the lower lid. Sclera normal, no visible drainage.        Assessment & Plan:  Hordeolum internum of right lower eyelid - Plan: erythromycin ophthalmic ointment  Recommend warm compresses and I will also treat her with Romycin. She will follow up with me Monday but is aware that if this gets much larger that she will need to see an eye doctor.   Time spent on call was 12 minutes and in review of previous records 15 minutes total.  This virtual service is not related to other E/M service within previous 7 days.

## 2020-01-26 ENCOUNTER — Encounter: Payer: Self-pay | Admitting: Certified Nurse Midwife

## 2020-01-26 DIAGNOSIS — L249 Irritant contact dermatitis, unspecified cause: Secondary | ICD-10-CM | POA: Diagnosis not present

## 2020-01-28 DIAGNOSIS — C9000 Multiple myeloma not having achieved remission: Secondary | ICD-10-CM | POA: Diagnosis not present

## 2020-02-09 ENCOUNTER — Encounter: Payer: Self-pay | Admitting: Family Medicine

## 2020-02-22 DIAGNOSIS — L989 Disorder of the skin and subcutaneous tissue, unspecified: Secondary | ICD-10-CM | POA: Diagnosis not present

## 2020-02-22 DIAGNOSIS — D485 Neoplasm of uncertain behavior of skin: Secondary | ICD-10-CM | POA: Diagnosis not present

## 2020-02-27 IMAGING — MG DIGITAL SCREENING BILATERAL MAMMOGRAM WITH TOMO AND CAD
8 series · 8 of 24 positions shown · non-contrast
Comparison: Previous exam(s).

CLINICAL DATA: Screening.

EXAM:
DIGITAL SCREENING BILATERAL MAMMOGRAM WITH TOMO AND CAD

[R CC synth-2D]
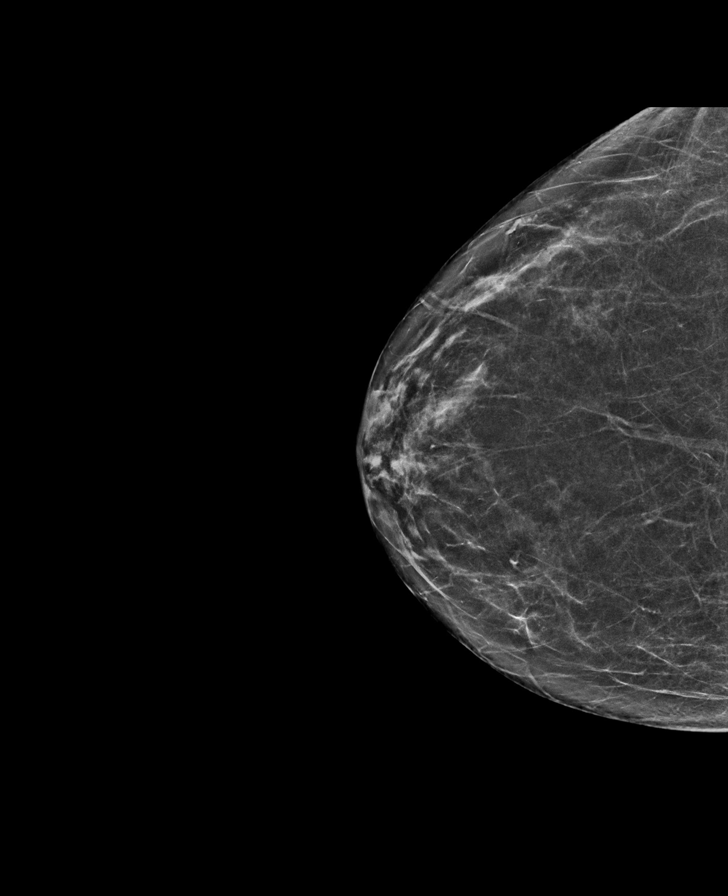

[R MLO synth-2D]
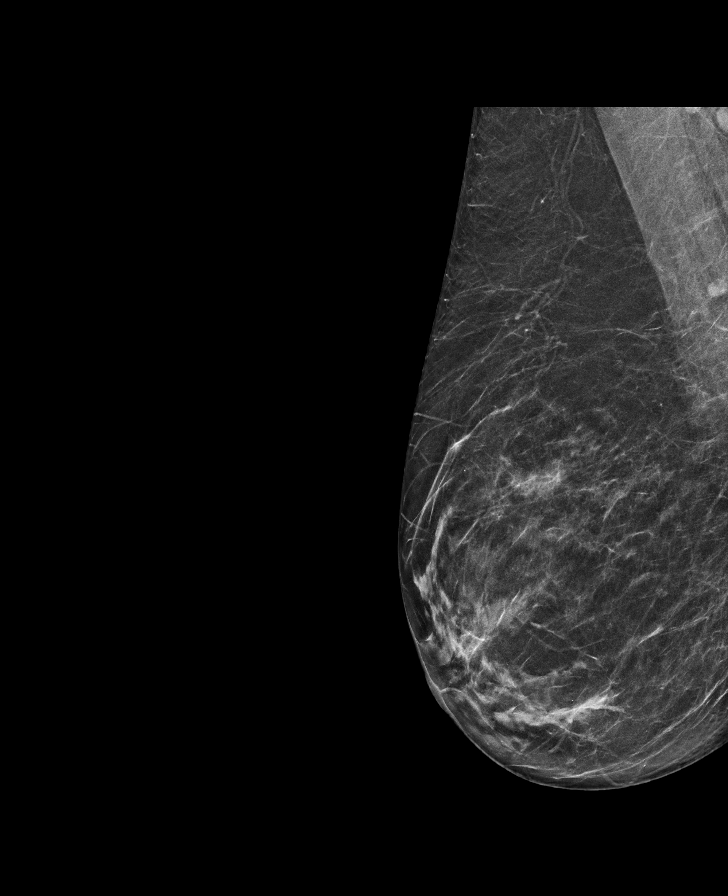

[L MLO synth-2D]
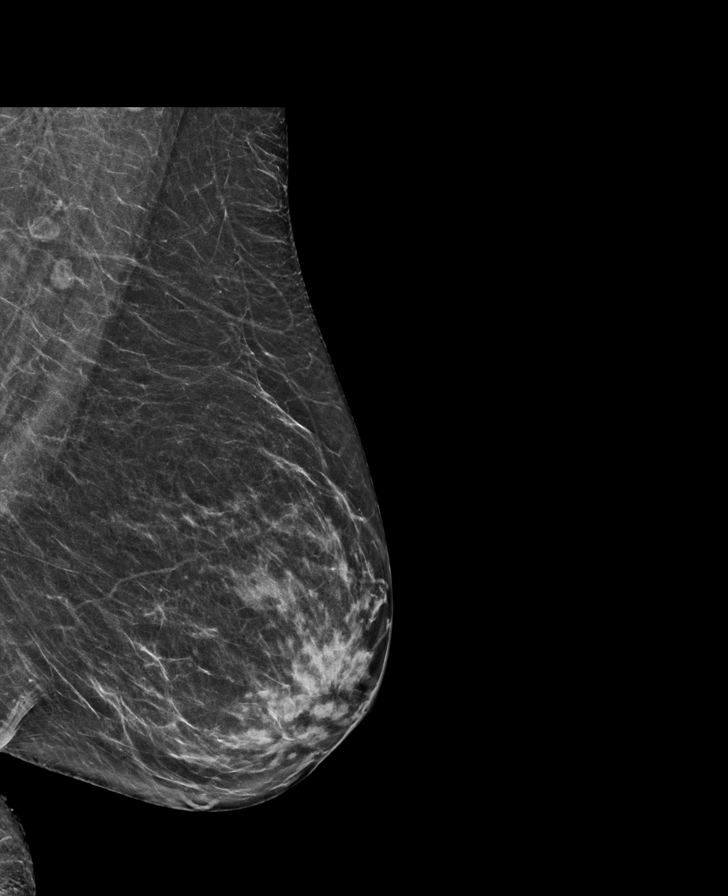

[L CC synth-2D]
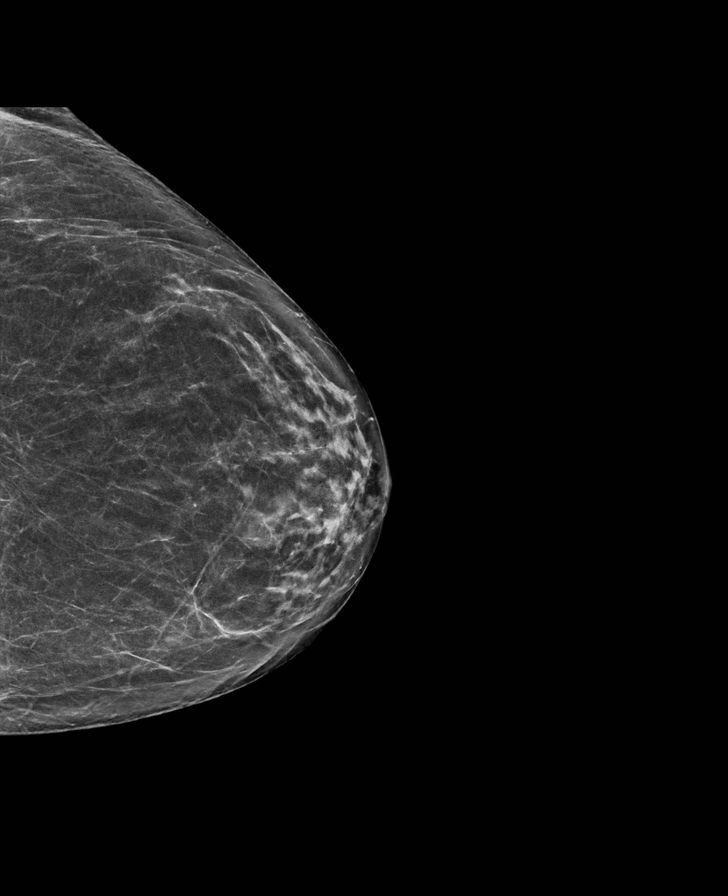

[R MLO tomo · tomo slice 32/63.0]
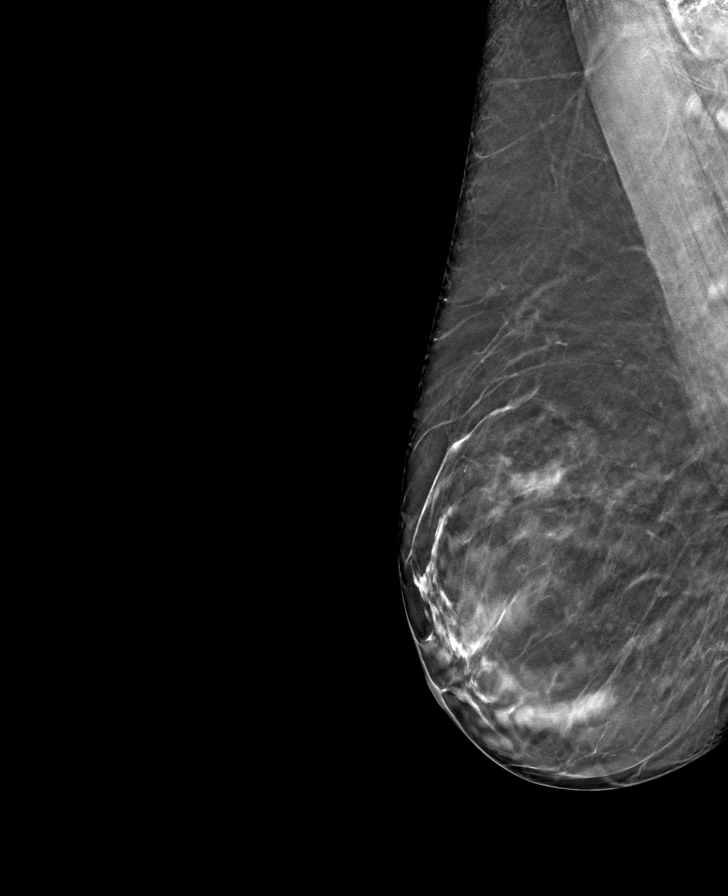

[L MLO tomo · tomo slice 33/64.0]
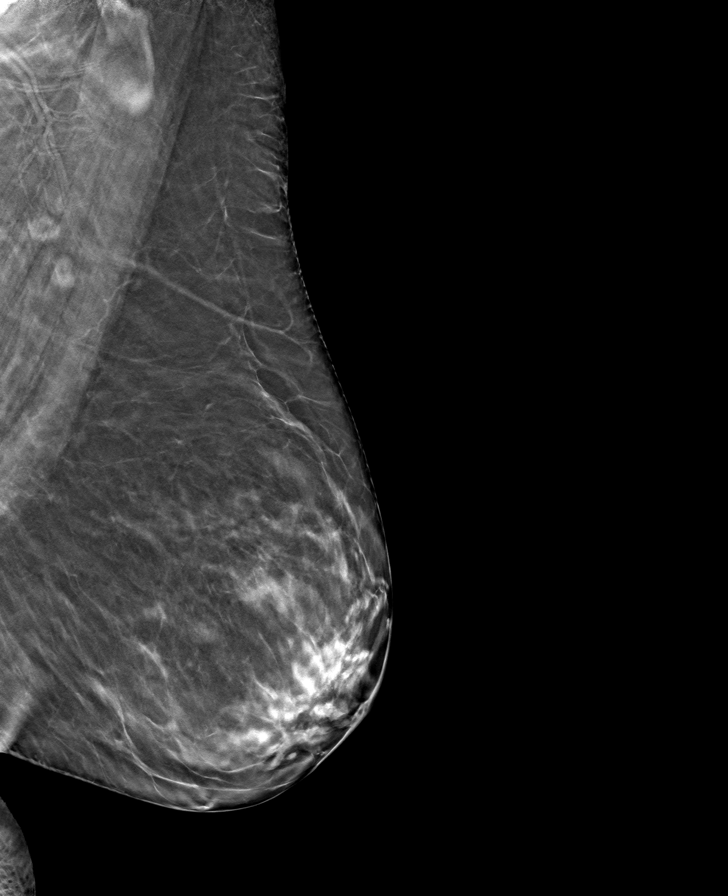

[L CC tomo · tomo slice 33/66.0]
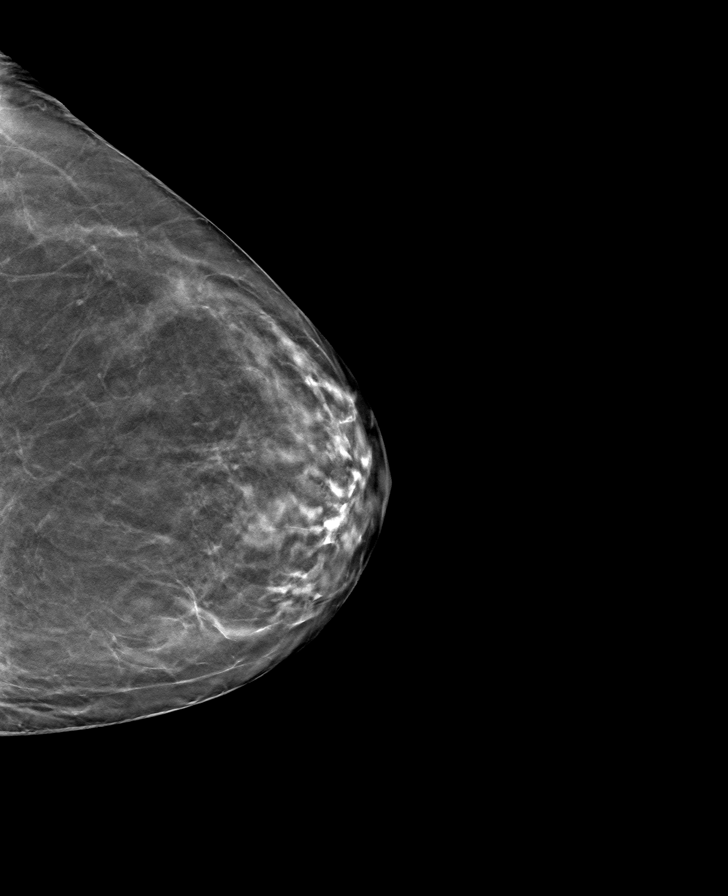

[R CC tomo · tomo slice 31/62.0]
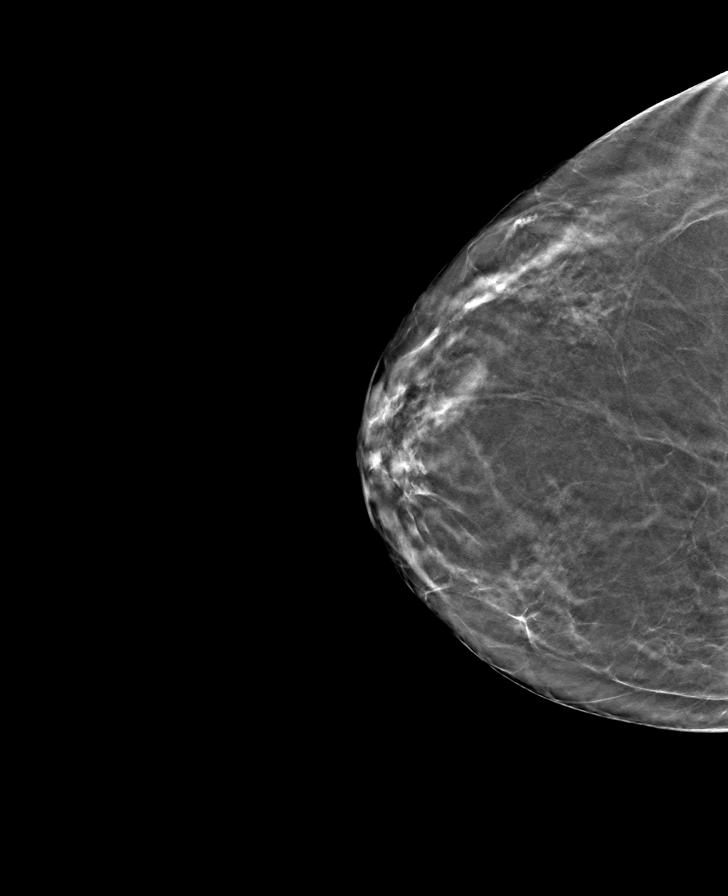

[8 of 24 positions shown; findings below may reference images not displayed]

ACR Breast Density Category b: There are scattered areas of
fibroglandular density.
FINDINGS: There are no findings suspicious for malignancy. Images were
processed with CAD.
IMPRESSION: No mammographic evidence of malignancy. A result letter of this
screening mammogram will be mailed directly to the patient.

RECOMMENDATION:
Screening mammogram in one year. (Code:[id number])

BI-RADS CATEGORY  1: Negative.

## 2020-04-19 DIAGNOSIS — D472 Monoclonal gammopathy: Secondary | ICD-10-CM | POA: Diagnosis not present

## 2020-04-19 DIAGNOSIS — C9000 Multiple myeloma not having achieved remission: Secondary | ICD-10-CM | POA: Diagnosis not present

## 2020-04-19 DIAGNOSIS — R6889 Other general symptoms and signs: Secondary | ICD-10-CM | POA: Diagnosis not present

## 2020-05-04 ENCOUNTER — Other Ambulatory Visit: Payer: Self-pay | Admitting: Family Medicine

## 2020-05-04 ENCOUNTER — Encounter: Payer: Self-pay | Admitting: Family Medicine

## 2020-05-04 DIAGNOSIS — Z1231 Encounter for screening mammogram for malignant neoplasm of breast: Secondary | ICD-10-CM

## 2020-05-31 ENCOUNTER — Other Ambulatory Visit: Payer: Self-pay | Admitting: Medical

## 2020-05-31 ENCOUNTER — Other Ambulatory Visit: Payer: Self-pay

## 2020-05-31 ENCOUNTER — Encounter: Payer: Self-pay | Admitting: Medical

## 2020-05-31 ENCOUNTER — Ambulatory Visit (INDEPENDENT_AMBULATORY_CARE_PROVIDER_SITE_OTHER): Payer: Medicare Other | Admitting: Medical

## 2020-05-31 VITALS — BP 110/72 | HR 97 | Ht 63.0 in | Wt 148.0 lb

## 2020-05-31 DIAGNOSIS — D472 Monoclonal gammopathy: Secondary | ICD-10-CM | POA: Diagnosis not present

## 2020-05-31 DIAGNOSIS — Z1231 Encounter for screening mammogram for malignant neoplasm of breast: Secondary | ICD-10-CM

## 2020-05-31 DIAGNOSIS — Z7189 Other specified counseling: Secondary | ICD-10-CM

## 2020-05-31 DIAGNOSIS — Z0000 Encounter for general adult medical examination without abnormal findings: Secondary | ICD-10-CM | POA: Diagnosis not present

## 2020-05-31 DIAGNOSIS — C9000 Multiple myeloma not having achieved remission: Secondary | ICD-10-CM | POA: Diagnosis not present

## 2020-05-31 DIAGNOSIS — Z1211 Encounter for screening for malignant neoplasm of colon: Secondary | ICD-10-CM | POA: Diagnosis not present

## 2020-05-31 DIAGNOSIS — K432 Incisional hernia without obstruction or gangrene: Secondary | ICD-10-CM | POA: Diagnosis not present

## 2020-05-31 DIAGNOSIS — E2839 Other primary ovarian failure: Secondary | ICD-10-CM

## 2020-05-31 DIAGNOSIS — Z780 Asymptomatic menopausal state: Secondary | ICD-10-CM

## 2020-05-31 DIAGNOSIS — Z7185 Encounter for immunization safety counseling: Secondary | ICD-10-CM

## 2020-05-31 DIAGNOSIS — M8580 Other specified disorders of bone density and structure, unspecified site: Secondary | ICD-10-CM

## 2020-05-31 NOTE — Progress Notes (Signed)
Subjective:    Lindsay Cook is a 69 y.o. female who presents for Preventative Services visit and chronic medical problems/med check visit.    Primary Care Provider Here at [place] Team:  Dentist, Dr. Trinna Balloon doctor, Dr. [facility], [place], [place]  Dr. Terrilyn Saver, [facility], [place], hematology  Concerns:  Her main issue is multiple myeloma that is being treated through oncology  She recently had a skin lesion on the left neck that was biopsied and found to be ringworm  She has not had shingles vaccine.  She is pretty sure she has had pneumococcal and tetanus vaccines up-to-date  No longer needs Pap smear per her gynecologist  Has done advainced directives.    Medical Services you may have received from other than [facility] providers in the past year (date may be approximate) Dr. Collier [facility]  [facility]   Exercise Current exercise habits: walking 50 minutes 7 days a week   Nutrition/Diet Current diet: in general, a "healthy" diet    Depression Screen Depression screen [facility] 2/9 05/31/2020  Decreased Interest 0  Down, Depressed, Hopeless 0  PHQ - 2 Score 0    Activities of Daily Living Screen/Functional Status Survey Is the patient deaf or have difficulty hearing?: No Does the patient have difficulty seeing, even when wearing glasses/contacts?: No Does the patient have difficulty concentrating, remembering, or making decisions?: No Does the patient have difficulty walking or climbing stairs?: No Does the patient have difficulty dressing or bathing?: No Does the patient have difficulty doing errands alone such as visiting a doctor's office or shopping?: No  Can patient draw a clock face showing 11:00 o'clock; yes   Fall Risk Screen Fall Risk  05/31/2020 04/29/2018 04/29/2018  Falls in the past year? 1 No No  Number falls in past yr: 0 - -  Injury  with Fall? 1 - -    Gait Assessment: Normal gait observed yes  Advanced directives Does patient have a [place]? Yes Does patient have a Living Will? Yes  Past Medical History:  Diagnosis Date  . 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 Ventral hernia     Past Surgical History:  Procedure Laterality Date  . APPENDECTOMY  1969  . COLECTOMY     Partial 2011  . COLONOSCOPY     q5 years, Dr. Acquanetta Sit, [facility], [place], due as of 05/2020  . HERNIA REPAIR     2014  . INSERTION OF MESH N/A 03/02/2013   Procedure: INSERTION OF MESH;  Surgeon: Odis Hollingshead, MD;  Location: [facility]S;  Service: General;  Laterality: N/A;  . VENTRAL HERNIA REPAIR N/A 03/02/2013   Procedure: LAPAROSCOPIC VENTRAL HERNIA;  Surgeon: Odis Hollingshead, MD;  Location: [facility];  Service: General;  Laterality: N/A;     Family History  Problem Relation Age of Onset  . Cancer Father        lung/brain  . Cancer Paternal Aunt        breast  . Breast cancer Maternal Aunt     No current outpatient medications on file.  Allergies  Allergen Reactions  . Prednisone     Can't Tolerate     History reviewed: allergies, current medications, past family history, past medical history, past social history, past surgical history and problem list  Chronic issues discussed: She  sees her oncologist regularly, has blood work very regularly, was advised to have a bone density test through us soon  Acute issues discussed: none  Objective:      Biometrics BP 110/72   Pulse 97   Ht 5' 3" (1.6 m)   Wt 148 lb (67.1 kg)   LMP 11/05/2002 (Approximate)   SpO2 96%   BMI 26.22 kg/m   Cognitive Testing  Alert? Yes  Normal Appearance?Yes  Oriented to person? Yes  Place? Yes   Time? Yes  Recall of three objects?  Yes  Can perform simple calculations? Yes  Displays appropriate judgment?Yes  Can read the correct  time from a watch face?Yes  General appearance: alert, no distress, WD/WN, white female  Nutritional Status: Inadequate [place] intake? no Loss of muscle mass? no Loss of fat beneath skin? no Localized or general edema? no Diminished functional status? no  Other pertinent exam: Neck: supple, no lymphadenopathy, no thyromegaly, no masses, no bruits Heart: RRR, normal S1, S2, no murmurs Lungs: CTA bilaterally, no wheezes, rhonchi, or rales Abdomen: +bs, soft, abdominal vertical surgical scars,  non tender, non distended, no masses, no hepatomegaly, no splenomegaly Musculoskeletal: nontender, no swelling, no obvious deformity Extremities: no edema, no cyanosis, no clubbing Pulses: 2+ symmetric, upper and lower extremities, normal cap refill Neurological: alert, oriented x 3, CN2-12 intact, strength normal upper extremities and lower extremities, sensation normal throughout, DTRs 2+ throughout, no cerebellar signs, gait normal Psychiatric: normal affect, behavior normal, pleasant  Breast/gyn - deferred/declined    Assessment:   Encounter Diagnoses  Name Primary?  . Smoldering multiple myeloma (HCC) Yes  . Medicare annual wellness visit, subsequent   . MGUS (monoclonal gammopathy of unknown significance)   . Estrogen deficiency   . Post-menopausal   . Osteopenia, unspecified location   . Vaccine counseling   . Incisional hernia, without obstruction or gangrene   . Screen for colon cancer   . Encounter for screening mammogram for malignant neoplasm of breast      Plan:   A preventative services visit was completed today.  During the course of the visit today, we discussed and counseled about appropriate screening and preventive services.  A health risk assessment was established today that included a review of current medications, allergies, social history, family history, medical and preventative health history, biometrics, and preventative screenings to identify potential  safety concerns or impairments.  A personalized plan was printed today for your records and use.   Personalized health advice and education was given today to reduce health risks and promote self management and wellness.  Information regarding end of life planning was discussed today.  Conditions/risks identified: MGUS, smoldering multiple myeloma-continue routine follow-up with oncology, reviewed labs that she brought in from June 2021  Referral for bone density test given estrogen deficiency, postmenopausal, nulliparity, history of osteopenia  She has an upcoming colon cancer screen already scheduled  She declines any additional labs today although we discussed lipid panel, vitamin D lab, EKG baseline  I reviewed her mammogram that was done 06/2019   Acute problems discussed today: none  Recommendations:  I recommend a yearly ophthalmology/optometry visit for glaucoma screening and eye checkup  I recommended a yearly dental visit for hygiene and checkup  Advanced directives - discussed nature and purpose of Advanced Directives, encouraged them to complete them if they have not done so and/or encouraged them to get us a copy if they have done this already.   Referrals today: Bone density test     Immunizations: I recommended a yearly influenza vaccine, typically in September when the vaccine is usually available Advise she check insurance coverage for shingles and tetanus boosters She is up-to-date on Covid vaccine  Avyanna was seen today for medicare wellness.  Diagnoses and all orders for this visit:  Smoldering multiple myeloma ([place])  Medicare annual wellness visit, subsequent  MGUS (monoclonal gammopathy of unknown significance)  Estrogen deficiency -     Cancel: DG Bone Density; Future  Post-menopausal -     Cancel: DG Bone Density; Future  Osteopenia, unspecified location -     Cancel: DG Bone Density; Future  Vaccine counseling  Incisional hernia,  without obstruction or gangrene  Screen for colon cancer  Encounter for screening mammogram for malignant neoplasm of breast     Medicare Attestation A preventative services visit was completed today.  During the course of the visit the patient was educated and counseled about appropriate screening and preventive services.  A health risk assessment was established with the patient that included a review of current medications, allergies, social history, family history, medical and preventative health history, biometrics, and preventative screenings to identify potential safety concerns or impairments.  A personalized plan was printed today for the patient's records and use.   Personalized health advice and education was given today to reduce health risks and promote self management and wellness.  Information regarding end of life planning was discussed today.  Dorothea Ogle, PA-C   05/31/2020

## 2020-06-02 ENCOUNTER — Encounter: Payer: Self-pay | Admitting: Family Medicine

## 2020-06-02 ENCOUNTER — Other Ambulatory Visit: Payer: Self-pay

## 2020-06-02 ENCOUNTER — Ambulatory Visit
Admission: RE | Admit: 2020-06-02 | Discharge: 2020-06-02 | Disposition: A | Discharge status: Home / Self Care | Payer: Medicare Other | Source: Ambulatory Visit | Attending: Medical | Admitting: Medical

## 2020-06-02 DIAGNOSIS — M8589 Other specified disorders of bone density and structure, multiple sites: Secondary | ICD-10-CM | POA: Diagnosis not present

## 2020-06-02 DIAGNOSIS — M8580 Other specified disorders of bone density and structure, unspecified site: Secondary | ICD-10-CM

## 2020-06-02 DIAGNOSIS — Z780 Asymptomatic menopausal state: Secondary | ICD-10-CM

## 2020-06-02 DIAGNOSIS — E2839 Other primary ovarian failure: Secondary | ICD-10-CM

## 2020-06-24 ENCOUNTER — Telehealth: Payer: Self-pay | Admitting: Medical

## 2020-06-24 NOTE — Telephone Encounter (Signed)
See her email about updated pneumonia vaccine history

## 2020-06-24 NOTE — Telephone Encounter (Signed)
Updated.

## 2020-06-28 ENCOUNTER — Other Ambulatory Visit: Payer: Self-pay

## 2020-06-28 ENCOUNTER — Ambulatory Visit
Admission: RE | Admit: 2020-06-28 | Discharge: 2020-06-28 | Disposition: A | Discharge status: Home / Self Care | Payer: Medicare Other | Source: Ambulatory Visit | Attending: Family Medicine | Admitting: Family Medicine

## 2020-06-28 DIAGNOSIS — Z1231 Encounter for screening mammogram for malignant neoplasm of breast: Secondary | ICD-10-CM

## 2020-07-20 DIAGNOSIS — D472 Monoclonal gammopathy: Secondary | ICD-10-CM | POA: Diagnosis not present

## 2020-07-20 DIAGNOSIS — R6889 Other general symptoms and signs: Secondary | ICD-10-CM | POA: Diagnosis not present

## 2020-07-20 DIAGNOSIS — C9000 Multiple myeloma not having achieved remission: Secondary | ICD-10-CM | POA: Diagnosis not present

## 2020-07-28 DIAGNOSIS — C9000 Multiple myeloma not having achieved remission: Secondary | ICD-10-CM | POA: Diagnosis not present

## 2020-08-18 ENCOUNTER — Other Ambulatory Visit: Payer: Self-pay

## 2020-08-18 ENCOUNTER — Telehealth: Payer: Self-pay | Admitting: *Deleted

## 2020-08-18 ENCOUNTER — Other Ambulatory Visit (INDEPENDENT_AMBULATORY_CARE_PROVIDER_SITE_OTHER): Payer: Medicare Other

## 2020-08-18 DIAGNOSIS — Z23 Encounter for immunization: Secondary | ICD-10-CM

## 2020-08-18 NOTE — Telephone Encounter (Signed)
Can do Covid booster at their convenience at this point.  Shingles vaccine:  I recommend you have a shingles vaccine to help prevent shingles or herpes zoster outbreak.   Please call your insurer to inquire about coverage for the Shingrix vaccine given in 2 doses.   Some insurers cover this vaccine after age 71, some cover this after age 74.  If your insurer covers this, then call to schedule appointment to have this vaccine here.

## 2020-08-18 NOTE — Telephone Encounter (Signed)
Patient was in for HD Flu shot and asked me to ask you if she was in need of any other vaccines. Pneumonia of any others, etc? Thanks.

## 2020-08-18 NOTE — Telephone Encounter (Signed)
Patient advised. She already had covid booster-I will abstract.

## 2020-08-23 DIAGNOSIS — D649 Anemia, unspecified: Secondary | ICD-10-CM | POA: Diagnosis not present

## 2020-08-23 DIAGNOSIS — D6189 Other specified aplastic anemias and other bone marrow failure syndromes: Secondary | ICD-10-CM | POA: Diagnosis not present

## 2020-08-23 DIAGNOSIS — C9000 Multiple myeloma not having achieved remission: Secondary | ICD-10-CM | POA: Diagnosis not present

## 2020-08-23 DIAGNOSIS — D704 Cyclic neutropenia: Secondary | ICD-10-CM | POA: Diagnosis not present

## 2020-10-18 DIAGNOSIS — C9000 Multiple myeloma not having achieved remission: Secondary | ICD-10-CM | POA: Diagnosis not present

## 2020-10-18 DIAGNOSIS — D472 Monoclonal gammopathy: Secondary | ICD-10-CM | POA: Diagnosis not present

## 2020-10-18 DIAGNOSIS — R6889 Other general symptoms and signs: Secondary | ICD-10-CM | POA: Diagnosis not present

## 2020-10-24 DIAGNOSIS — C880 Waldenstrom macroglobulinemia: Secondary | ICD-10-CM | POA: Diagnosis not present

## 2020-11-14 DIAGNOSIS — Z86010 Personal history of colonic polyps: Secondary | ICD-10-CM | POA: Diagnosis not present

## 2020-11-14 DIAGNOSIS — K5730 Diverticulosis of large intestine without perforation or abscess without bleeding: Secondary | ICD-10-CM | POA: Diagnosis not present

## 2020-11-14 DIAGNOSIS — K640 First degree hemorrhoids: Secondary | ICD-10-CM | POA: Diagnosis not present

## 2020-11-14 DIAGNOSIS — K649 Unspecified hemorrhoids: Secondary | ICD-10-CM | POA: Diagnosis not present

## 2020-11-14 DIAGNOSIS — Z8371 Family history of colonic polyps: Secondary | ICD-10-CM | POA: Diagnosis not present

## 2020-11-14 DIAGNOSIS — Z9889 Other specified postprocedural states: Secondary | ICD-10-CM | POA: Diagnosis not present

## 2020-11-14 DIAGNOSIS — Z1211 Encounter for screening for malignant neoplasm of colon: Secondary | ICD-10-CM | POA: Diagnosis not present

## 2020-11-14 DIAGNOSIS — Z980 Intestinal bypass and anastomosis status: Secondary | ICD-10-CM | POA: Diagnosis not present

## 2020-12-05 DIAGNOSIS — H43813 Vitreous degeneration, bilateral: Secondary | ICD-10-CM | POA: Diagnosis not present

## 2020-12-05 DIAGNOSIS — H524 Presbyopia: Secondary | ICD-10-CM | POA: Diagnosis not present

## 2020-12-05 DIAGNOSIS — H25813 Combined forms of age-related cataract, bilateral: Secondary | ICD-10-CM | POA: Diagnosis not present

## 2020-12-05 DIAGNOSIS — H04123 Dry eye syndrome of bilateral lacrimal glands: Secondary | ICD-10-CM | POA: Diagnosis not present

## 2021-01-09 DIAGNOSIS — C880 Waldenstrom macroglobulinemia: Secondary | ICD-10-CM | POA: Diagnosis not present

## 2021-01-09 DIAGNOSIS — C8590 Non-Hodgkin lymphoma, unspecified, unspecified site: Secondary | ICD-10-CM | POA: Diagnosis not present

## 2021-01-09 DIAGNOSIS — R6889 Other general symptoms and signs: Secondary | ICD-10-CM | POA: Diagnosis not present

## 2021-01-09 DIAGNOSIS — D472 Monoclonal gammopathy: Secondary | ICD-10-CM | POA: Diagnosis not present

## 2021-01-13 DIAGNOSIS — R21 Rash and other nonspecific skin eruption: Secondary | ICD-10-CM | POA: Diagnosis not present

## 2021-01-16 DIAGNOSIS — Z0000 Encounter for general adult medical examination without abnormal findings: Secondary | ICD-10-CM | POA: Diagnosis not present

## 2021-01-16 DIAGNOSIS — C880 Waldenstrom macroglobulinemia: Secondary | ICD-10-CM | POA: Diagnosis not present

## 2021-02-06 DIAGNOSIS — L718 Other rosacea: Secondary | ICD-10-CM | POA: Diagnosis not present

## 2021-02-06 DIAGNOSIS — L570 Actinic keratosis: Secondary | ICD-10-CM | POA: Diagnosis not present

## 2021-02-06 DIAGNOSIS — R21 Rash and other nonspecific skin eruption: Secondary | ICD-10-CM | POA: Diagnosis not present

## 2021-02-06 DIAGNOSIS — L821 Other seborrheic keratosis: Secondary | ICD-10-CM | POA: Diagnosis not present

## 2021-02-21 ENCOUNTER — Other Ambulatory Visit: Payer: Self-pay | Admitting: Internal Medicine

## 2021-05-16 ENCOUNTER — Other Ambulatory Visit: Payer: Self-pay | Admitting: Family Medicine

## 2021-05-16 DIAGNOSIS — Z1231 Encounter for screening mammogram for malignant neoplasm of breast: Secondary | ICD-10-CM

## 2021-05-22 ENCOUNTER — Ambulatory Visit: Payer: Medicare Other | Attending: Internal Medicine

## 2021-05-22 ENCOUNTER — Other Ambulatory Visit (HOSPITAL_BASED_OUTPATIENT_CLINIC_OR_DEPARTMENT_OTHER): Payer: Self-pay

## 2021-05-22 ENCOUNTER — Other Ambulatory Visit: Payer: Self-pay

## 2021-05-22 DIAGNOSIS — Z23 Encounter for immunization: Secondary | ICD-10-CM

## 2021-05-22 MED ORDER — PFIZER-BIONT COVID-19 VAC-TRIS 30 MCG/0.3ML IM SUSP
INTRAMUSCULAR | 0 refills | Status: DC
  Filled 2021-05-22: qty 0.3, 1d supply, fill #0

## 2021-05-22 NOTE — Progress Notes (Signed)
   Covid-19 Vaccination Clinic  Name:  Lindsay Cook    MRN: [id number] DOB: 01-16-1951  05/22/2021  [name] was observed post Covid-19 immunization for 15 minutes without incident. She was provided with Vaccine Information Sheet and instruction to access the V-Safe system.   [name] was instructed to call 911 with any severe reactions post vaccine: Difficulty breathing  Swelling of face and throat  A fast heartbeat  A bad rash all over body  Dizziness and weakness   Immunizations Administered     Name Date Dose VIS Date Route   PFIZER Comrnaty([place]) Covid-19 Vaccine 05/22/2021 10:25 AM 0.3 mL 10/13/2020 Intramuscular   Manufacturer: [place]   Lot: [id number]   [place]: [phone]

## 2021-07-18 ENCOUNTER — Ambulatory Visit
Admission: RE | Admit: 2021-07-18 | Discharge: 2021-07-18 | Disposition: A | Discharge status: Home / Self Care | Payer: Medicare Other | Source: Ambulatory Visit | Attending: Family Medicine | Admitting: Family Medicine

## 2021-07-18 ENCOUNTER — Other Ambulatory Visit: Payer: Self-pay

## 2021-07-18 DIAGNOSIS — Z1231 Encounter for screening mammogram for malignant neoplasm of breast: Secondary | ICD-10-CM | POA: Diagnosis not present

## 2021-07-19 DIAGNOSIS — D472 Monoclonal gammopathy: Secondary | ICD-10-CM | POA: Diagnosis not present

## 2021-07-19 DIAGNOSIS — R6889 Other general symptoms and signs: Secondary | ICD-10-CM | POA: Diagnosis not present

## 2021-07-19 DIAGNOSIS — C9000 Multiple myeloma not having achieved remission: Secondary | ICD-10-CM | POA: Diagnosis not present

## 2021-08-03 DIAGNOSIS — C9000 Multiple myeloma not having achieved remission: Secondary | ICD-10-CM | POA: Diagnosis not present

## 2021-08-03 DIAGNOSIS — C880 Waldenstrom macroglobulinemia: Secondary | ICD-10-CM | POA: Diagnosis not present

## 2021-08-14 ENCOUNTER — Other Ambulatory Visit: Payer: Medicare Other

## 2021-08-17 ENCOUNTER — Other Ambulatory Visit (INDEPENDENT_AMBULATORY_CARE_PROVIDER_SITE_OTHER): Payer: Medicare Other

## 2021-08-17 ENCOUNTER — Other Ambulatory Visit: Payer: Self-pay

## 2021-08-17 DIAGNOSIS — Z23 Encounter for immunization: Secondary | ICD-10-CM | POA: Diagnosis not present

## 2021-09-06 ENCOUNTER — Ambulatory Visit (INDEPENDENT_AMBULATORY_CARE_PROVIDER_SITE_OTHER): Payer: Medicare Other

## 2021-09-06 ENCOUNTER — Other Ambulatory Visit: Payer: Self-pay

## 2021-09-06 DIAGNOSIS — Z23 Encounter for immunization: Secondary | ICD-10-CM | POA: Diagnosis not present

## 2021-09-19 DIAGNOSIS — M720 Palmar fascial fibromatosis [Dupuytren]: Secondary | ICD-10-CM | POA: Insufficient documentation

## 2021-09-19 DIAGNOSIS — M79641 Pain in right hand: Secondary | ICD-10-CM | POA: Diagnosis not present

## 2021-09-19 DIAGNOSIS — M79642 Pain in left hand: Secondary | ICD-10-CM | POA: Diagnosis not present

## 2022-01-16 DIAGNOSIS — R6889 Other general symptoms and signs: Secondary | ICD-10-CM | POA: Diagnosis not present

## 2022-01-16 DIAGNOSIS — D472 Monoclonal gammopathy: Secondary | ICD-10-CM | POA: Diagnosis not present

## 2022-01-16 DIAGNOSIS — C9000 Multiple myeloma not having achieved remission: Secondary | ICD-10-CM | POA: Diagnosis not present

## 2022-01-22 DIAGNOSIS — C880 Waldenstrom macroglobulinemia: Secondary | ICD-10-CM | POA: Diagnosis not present

## 2022-02-08 DIAGNOSIS — D126 Benign neoplasm of colon, unspecified: Secondary | ICD-10-CM | POA: Insufficient documentation

## 2022-02-14 ENCOUNTER — Encounter: Payer: Self-pay | Admitting: Physician Assistant

## 2022-03-09 DIAGNOSIS — H10411 Chronic giant papillary conjunctivitis, right eye: Secondary | ICD-10-CM | POA: Diagnosis not present

## 2022-03-09 DIAGNOSIS — H25813 Combined forms of age-related cataract, bilateral: Secondary | ICD-10-CM | POA: Diagnosis not present

## 2022-03-09 DIAGNOSIS — H0012 Chalazion right lower eyelid: Secondary | ICD-10-CM | POA: Diagnosis not present

## 2022-03-19 ENCOUNTER — Encounter: Payer: Self-pay | Admitting: Physician Assistant

## 2022-03-19 ENCOUNTER — Ambulatory Visit (INDEPENDENT_AMBULATORY_CARE_PROVIDER_SITE_OTHER): Payer: Medicare Other | Admitting: Physician Assistant

## 2022-03-19 VITALS — BP 124/78 | HR 78 | Temp 98.1°F | Ht 63.0 in | Wt 170.4 lb

## 2022-03-19 DIAGNOSIS — L255 Unspecified contact dermatitis due to plants, except food: Secondary | ICD-10-CM

## 2022-03-19 MED ORDER — METHYLPREDNISOLONE 4 MG PO TBPK: ORAL_TABLET | ORAL | 0 refills | Status: DC

## 2022-03-19 NOTE — Patient Instructions (Signed)
You can take an over the counter antihistamine to help with allergic rhinitis / itching / hives: NON-DROWSY Allegra (Fexofenadine) 180 mg daily or NON-Drowsy Claritin (Loratidine) 10 mg daily  or DROWSY Benadryl (Diphenhydramine) 25 mg as directed or Zyrtec (Cetirizine) 10 mg daily. You can go to a store with a pharmacy and ask them to help you find these medicines. ? ?

## 2022-03-19 NOTE — Progress Notes (Signed)
? ?Acute Office Visit ? ?Subjective:  ? ? Patient ID: Lindsay Cook, female    DOB: 03-29-1951, 71 y.o.   MRN: [id number] ? ?Chief Complaint  ?Patient presents with  ? other  ?  Poison ivy on face started last Tuesday and Saturday got more swollen around the eye.   ? ? ?HPI ?Patient is in today for a 6 day history of itchy red rash on the left side of her face near her left eye; reports she was outside and got into poison ivy or poison oak; states she's had it in the past and even though she gets very wired from steroids, she can take them in limited doses; states she bough Benadryl but has not taken it because it makes her sleepy; has been using OTC cortisone cream on her face and it is helping; reports that her vision is normal. ? ?Outpatient Medications Prior to Visit  ?Medication Sig Dispense Refill  ? neomycin-polymyxin-dexameth (MAXITROL) 0.1 % OINT Place 1 application. into the right eye 3 (three) times daily.    ? COVID-19 mRNA Vac-TriS, Pfizer, (PFIZER-BIONT COVID-19 VAC-TRIS) SUSP injection Inject into the muscle. (Patient not taking: Reported on 03/19/2022) 0.3 mL 0  ? ?No facility-administered medications prior to visit.  ? ? ?Allergies  ?Allergen Reactions  ? Prednisone   ?  Can't Tolerate   ? ? ?Review of Systems  ?Constitutional:  Negative for activity change and chills.  ?HENT:  Negative for congestion and voice change.   ?Eyes:  Negative for pain and redness.  ?Respiratory:  Negative for cough and wheezing.   ?Cardiovascular:  Negative for chest pain.  ?Gastrointestinal:  Negative for constipation, diarrhea, nausea and vomiting.  ?Endocrine: Negative for polyuria.  ?Genitourinary:  Negative for frequency.  ?Skin:  Positive for color change and rash.  ?Allergic/Immunologic: Negative for immunocompromised state.  ?Neurological:  Negative for dizziness.  ?Psychiatric/Behavioral:  Negative for agitation.   ? ?   ?Objective:  ?  ?Physical Exam ?Vitals and nursing note reviewed.   ?Constitutional:   ?   General: She is not in acute distress. ?   Appearance: Normal appearance. She is not ill-appearing.  ?HENT:  ?   Head: Normocephalic and atraumatic.  ?   Right Ear: External ear normal.  ?   Left Ear: External ear normal.  ?   Nose: No congestion.  ?Eyes:  ?   General: Vision grossly intact.  ?   Extraocular Movements: Extraocular movements intact.  ?   Conjunctiva/sclera: Conjunctivae normal.  ?   Right eye: Right conjunctiva is not injected.  ?   Left eye: Left conjunctiva is not injected.  ?   Pupils: Pupils are equal, round, and reactive to light.  ?Cardiovascular:  ?   Rate and Rhythm: Normal rate and regular rhythm.  ?   Pulses: Normal pulses.  ?   Heart sounds: Normal heart sounds.  ?Pulmonary:  ?   Effort: Pulmonary effort is normal.  ?   Breath sounds: Normal breath sounds. No wheezing.  ?Abdominal:  ?   General: Bowel sounds are normal.  ?   Palpations: Abdomen is soft.  ?Musculoskeletal:     ?   General: Normal range of motion.  ?   Cervical back: Normal range of motion and neck supple.  ?   Right lower leg: No edema.  ?   Left lower leg: No edema.  ?Skin: ?   General: Skin is warm and dry.  ?   Findings: Erythema and  rash present. No bruising. Rash is urticarial.  ?   Comments: Erythematous plaque on left side of face adjacent to eye, minimal swelling of eye   ?Neurological:  ?   General: No focal deficit present.  ?   Mental Status: She is alert and oriented to person, place, and time.  ?Psychiatric:     ?   Mood and Affect: Mood normal.     ?   Behavior: Behavior normal.     ?   Thought Content: Thought content normal.  ? ? ?BP 124/78   Pulse 78   Temp 98.1 ?F (36.7 ?C)   Wt 170 lb 6.4 oz (77.3 kg)   LMP 11/05/2002 (Approximate)   BMI 30.19 kg/m?  ? ?Wt Readings from Last 3 Encounters:  ?03/19/22 170 lb 6.4 oz (77.3 kg)  ?05/31/20 148 lb (67.1 kg)  ?01/22/20 145 lb (65.8 kg)  ? ? ?Results for orders placed or performed in visit on 02/08/22  ?[facility] COLONOSCOPY  ?Result Value  Ref Range  ? [facility] Colonoscopy See Report (in chart) See Report (in chart), Patient Reported  ? ? ?   ?Assessment & Plan:  ?1. Rhus dermatitis ?- You can take an over the counter antihistamine to help with allergic rhinitis / itching / hives: NON-DROWSY Allegra (Fexofenadine) 180 mg daily or NON-Drowsy Claritin (Loratidine) 10 mg daily  or DROWSY Benadryl (Diphenhydramine) 25 mg as directed or Zyrtec (Cetirizine) 10 mg daily. You can go to a store with a pharmacy and ask them to help you find these medicines. ? ?If symptoms get worse, please call the office for a follow up appointment if available, otherwise go to Urgent Care, call 911 / EMS, or go to the Emergency Department for further evaluation. ? ? ?Meds ordered this encounter  ?Medications  ? methylPREDNISolone (MEDROL DOSEPAK) 4 MG TBPK tablet  ?  Sig: Take as directed  ?  Dispense:  21 tablet  ?  Refill:  0  ?  Patient has taken in the past and tolerated  ?  Order Specific Question:   Supervising Provider  ?  Answer:   Denita Lung [[id number]]  ? ? ?Return in about 6 months (around 09/19/2022) for Return for Annual Exam with PCP Jimmye Norman. ? ?Irene Pap, PA-C ?

## 2022-04-12 ENCOUNTER — Telehealth: Payer: Self-pay | Admitting: Physician Assistant

## 2022-04-12 NOTE — Telephone Encounter (Signed)
If I remember correctly, Medicare doesn't pay for tetanus shots in the office.  She can go to her preferred pharmacy to get it.  Lindsay Cook, please call her to let her know.

## 2022-04-12 NOTE — Telephone Encounter (Signed)
Thank you :)

## 2022-04-12 NOTE — Telephone Encounter (Signed)
Lindsay Cook asks if she can be approved to come in for a td shot.

## 2022-04-12 NOTE — Telephone Encounter (Signed)
Pt called me back and her pharmacy does not do the tetanus shots and she does not want to go to another pharmacy. She wants to have it done here and  wants to know what the price will be she's willing to pay outta pocket. She had medicare and BC[employer] secondary

## 2022-04-16 ENCOUNTER — Telehealth: Payer: Self-pay

## 2022-04-16 NOTE — Telephone Encounter (Signed)
Pt will call back and scheduled Tetanus shot. She is aware she will have to pay $145

## 2022-05-04 ENCOUNTER — Encounter: Payer: Self-pay | Admitting: Internal Medicine

## 2022-05-16 DIAGNOSIS — H25813 Combined forms of age-related cataract, bilateral: Secondary | ICD-10-CM | POA: Diagnosis not present

## 2022-05-16 DIAGNOSIS — H5203 Hypermetropia, bilateral: Secondary | ICD-10-CM | POA: Diagnosis not present

## 2022-05-16 DIAGNOSIS — H524 Presbyopia: Secondary | ICD-10-CM | POA: Diagnosis not present

## 2022-06-04 ENCOUNTER — Other Ambulatory Visit: Payer: Self-pay | Admitting: Medical

## 2022-06-04 DIAGNOSIS — Z1231 Encounter for screening mammogram for malignant neoplasm of breast: Secondary | ICD-10-CM

## 2022-06-29 ENCOUNTER — Telehealth: Payer: Self-pay | Admitting: Physician Assistant

## 2022-06-29 NOTE — Telephone Encounter (Signed)
Left message for patient to call back and schedule Medicare Annual Wellness Visit (AWV) either virtually or in office. I left my number for patient to call [phone].  Last AWV ;05/31/20  please schedule at anytime with health coach

## 2022-07-06 ENCOUNTER — Ambulatory Visit (INDEPENDENT_AMBULATORY_CARE_PROVIDER_SITE_OTHER): Payer: Medicare Other

## 2022-07-06 VITALS — Ht 63.0 in | Wt 165.0 lb

## 2022-07-06 DIAGNOSIS — Z0000 Encounter for general adult medical examination without abnormal findings: Secondary | ICD-10-CM

## 2022-07-06 NOTE — Progress Notes (Signed)
I connected with Renly Guedes today by telephone and verified that I am speaking with the correct person using two identifiers. Location patient: home Location provider: work Persons participating in the virtual visit: Marria, Mathison LPN.   I discussed the limitations, risks, security and privacy concerns of performing an evaluation and management service by telephone and the availability of in person appointments. I also discussed with the patient that there may be a patient responsible charge related to this service. The patient expressed understanding and verbally consented to this telephonic visit.    Interactive audio and video telecommunications were attempted between this provider and patient, however failed, due to patient having technical difficulties OR patient did not have access to video capability.  We continued and completed visit with audio only.     Vital signs may be patient reported or missing.  Subjective:   Lindsay Cook is a 71 y.o. female who presents for Medicare Annual (Subsequent) preventive examination.  Review of Systems     Cardiac Risk Factors include: advanced age (>95mn, >>59women)     Objective:    Today's Vitals   07/06/22 0857  Weight: 165 lb (74.8 kg)  Height: '5\' 3"'$  (1.6 m)   Body mass index is 29.23 kg/m.     07/06/2022    9:01 AM 05/31/2020   12:35 PM 03/02/2013    2:04 PM 03/02/2013    7:17 AM  Advanced Directives  Does Patient Have a Medical Advance Directive? Yes Yes Patient has advance directive, copy not in chart Patient has advance directive, copy not in chart  Type of Advance [place]ive [place] Living will Living will  Copy of [place] Chart? No - copy requested  Copy requested from family   Pre-existing out of facility DNR order (yellow form or pink MOST form)   No No    Current Medications  (verified) Outpatient Encounter Medications as of 07/06/2022  Medication Sig   methylPREDNISolone (MEDROL DOSEPAK) 4 MG TBPK tablet Take as directed (Patient not taking: Reported on 07/06/2022)   neomycin-polymyxin-dexameth (MAXITROL) 0.1 % OINT Place 1 application. into the right eye 3 (three) times daily. (Patient not taking: Reported on 07/06/2022)   No facility-administered encounter medications on file as of 07/06/2022.    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 [place]etta [place] due as of 05/2020   HERNIA REPAIR     2014   INSERTION OF MESH N/A 03/02/2013   Procedure: INSERTION OF MESH;  Surgeon: TOdis Hollingshead MD;  Location: [facility];  Service: General;  Laterality: N/A;   VENTRAL HERNIA REPAIR N/A 03/02/2013   Procedure: LAPAROSCOPIC VENTRAL HERNIA;  Surgeon: TOdis Hollingshead MD;  Location: [facility];  Service: General;  Laterality: N/A;   Family History  Problem Relation Age of Onset   Cancer Father        lung/brain   Cancer Paternal Aunt        breast   Breast cancer Maternal Aunt    Social History   Socioeconomic History   Marital status: Widowed  Spouse name: Not on file   Number of children: Not on file   Years of education: Not on file   Highest education level: Not on file  Occupational History   Not on file  Tobacco Use   Smoking status: Never   Smokeless tobacco: Never  Vaping Use   Vaping Use: Never used  Substance and Sexual Activity   Alcohol use: Yes    Comment: occasional beer   Drug use: No   Sexual activity: Never    Birth control/protection: Abstinence, Post-menopausal  Other Topics Concern   Not on file  Social History  Narrative   Lives with her cat.  No children.  Former Radio broadcast assistant.  Exercise - walks 3-4 miles daily.  30 wall pushups.   05/2020.   Social Determinants of Health   Financial Resource Strain: Low Risk  (07/06/2022)   Overall Financial Resource Strain (CARDIA)    Difficulty of Paying Living Expenses: Not hard at all  Food Insecurity: No Food Insecurity (07/06/2022)   Hunger Vital Sign    Worried About Running Out of Food in the Last Year: Never true    Ran Out of Food in the Last Year: Never true  Transportation Needs: No Transportation Needs (07/06/2022)   PRAPARE - Hydrologist (Medical): No    Lack of Transportation (Non-Medical): No  Physical Activity: Sufficiently Active (07/06/2022)   Exercise Vital Sign    Days of Exercise per Week: 7 days    Minutes of Exercise per Session: 60 min  Stress: No Stress Concern Present (07/06/2022)   [place]    Feeling of Stress : Not at all  Social Connections: Not on file    Tobacco Counseling Counseling given: Not Answered   Clinical Intake:  Pre-visit preparation completed: Yes  Pain : No/denies pain     Nutritional Status: BMI 25 -29 Overweight Nutritional Risks: None Diabetes: No  How often do you need to have someone help you when you read instructions, pamphlets, or other written materials from your doctor or pharmacy?: 1 - Never What is the last grade level you completed in school?: 11yr college  Diabetic? no  Interpreter Needed?: No  Information entered by :: NAllen LPN   Activities of Daily Living    07/06/2022    9:03 AM  In your present state of health, do you have any difficulty performing the following activities:  Hearing? 0  Vision? 0  Difficulty concentrating or making decisions? 0  Walking or climbing stairs? 0  Dressing or bathing? 0  Doing errands, shopping? 0  Preparing Food and eating ? N  Using the Toilet? N  In  the past six months, have you accidently leaked urine? N  Do you have problems with loss of bowel control? N  Managing your Medications? N  Managing your Finances? N  Housekeeping or managing your Housekeeping? N    Patient Care Team: WMarcellina Millinas PCP - General (Physician Assistant)  Indicate any recent Medical Services you may have received from other than [facility] providers in the past year (date may be approximate).     Assessment:   This is a routine wellness examination for CVirgina  Hearing/Vision screen Vision Screening - Comments:: Regular eye exams, [facility]  Dietary issues and exercise activities discussed: Current Exercise Habits: Home exercise routine, Type of exercise: walking, Time (Minutes): 55, Frequency (Times/Week): 7, Weekly Exercise (Minutes/Week): 385   Goals  Addressed             This Visit's Progress    Patient Stated       07/06/2022, wants to lose weight       Depression Screen    07/06/2022    9:02 AM 02/21/2021   10:36 AM 05/31/2020   12:36 PM 04/29/2018    4:00 PM  PHQ 2/9 Scores  PHQ - 2 Score 0 0 0 0  PHQ- 9 Score 0       Fall Risk    07/06/2022    9:01 AM 02/21/2021   10:36 AM 05/31/2020   12:35 PM 04/29/2018    4:00 PM 04/29/2018    3:53 PM  Fall Risk   Falls in the past year? 1 0 1 No No  Comment tripped over rug      Number falls in past yr: 0 0 0    Injury with Fall? 0 0 1    Risk for fall due to : No Fall Risks No Fall Risks     Follow up Falls prevention discussed;Education provided;Falls evaluation completed Falls evaluation completed       FALL RISK PREVENTION PERTAINING TO THE HOME:  Any stairs in or around the home? Yes  If so, are there any without handrails? No  Home free of loose throw rugs in walkways, pet beds, electrical cords, etc? Yes  Adequate lighting in your home to reduce risk of falls? Yes   ASSISTIVE DEVICES UTILIZED TO PREVENT FALLS:  Life alert? No  Use of a cane, walker or w/c? No   Grab bars in the bathroom? No  Shower chair or bench in shower? No  Elevated toilet seat or a handicapped toilet? Yes   TIMED UP AND GO:  Was the test performed? No .      Cognitive Function:        07/06/2022    9:04 AM  6CIT Screen  What Year? 0 points  What month? 0 points  What time? 0 points  Count back from 20 0 points  Months in reverse 0 points  Repeat phrase 2 points  Total Score 2 points    Immunizations Immunization History  Administered Date(s) Administered   Fluad Quad(high Dose 65+) 08/18/2020, 08/17/2021   Influenza, High Dose Seasonal PF 07/25/2016, 08/08/2017, 08/14/2018   Influenza,inj,Quad PF,6+ Mos 09/20/2014   Influenza-Unspecified 09/15/2013   PFIZER Comirnaty(Gray Top)Covid-19 Tri-Sucrose Vaccine 05/22/2021   PFIZER(Purple Top)SARS-COV-2 Vaccination 12/10/2019, 01/04/2020, 07/06/2020   Pfizer Covid-19 Vaccine Bivalent Booster 25yr & up 09/06/2021   Pneumococcal Conjugate-13 09/20/2014   Pneumococcal Polysaccharide-23 01/03/2015    TDAP status: Due, Education has been provided regarding the importance of this vaccine. Advised may receive this vaccine at local pharmacy or Health Dept. Aware to provide a copy of the vaccination record if obtained from local pharmacy or Health Dept. Verbalized acceptance and understanding.  Flu Vaccine status: Due, Education has been provided regarding the importance of this vaccine. Advised may receive this vaccine at local pharmacy or Health Dept. Aware to provide a copy of the vaccination record if obtained from local pharmacy or Health Dept. Verbalized acceptance and understanding.  Pneumococcal vaccine status: Up to date  Covid-19 vaccine status: Completed vaccines  Qualifies for Shingles Vaccine? Yes   Zostavax completed No   Shingrix Completed?: No.    Education has been provided regarding the importance of this vaccine. Patient has been advised to call insurance company to determine out of pocket expense  if  they have not yet received this vaccine. Advised may also receive vaccine at local pharmacy or Health Dept. Verbalized acceptance and understanding.  Screening Tests Health Maintenance  Topic Date Due   TETANUS/TDAP  Never done   Zoster Vaccines- Shingrix (1 of 2) Never done   COVID-19 Vaccine (6 - Pfizer risk series) 11/01/2021   INFLUENZA VACCINE  06/05/2022   MAMMOGRAM  07/18/2022   COLONOSCOPY (Pts 45-20yr Insurance coverage will need to be confirmed)  09/20/2025   Pneumonia Vaccine 71 Years old  Completed   DEXA SCAN  Completed   Hepatitis C Screening  Completed   HPV VACCINES  Aged Out    Health Maintenance  Health Maintenance Due  Topic Date Due   TETANUS/TDAP  Never done   Zoster Vaccines- Shingrix (1 of 2) Never done   COVID-19 Vaccine (6 - Pfizer risk series) 11/01/2021   INFLUENZA VACCINE  06/05/2022    Colorectal cancer screening: Type of screening: Colonoscopy. Completed 2021. Repeat every 5 years  Mammogram status: scheduled for 07/19/2022  Bone Density status: Completed 06/02/2020.   Lung Cancer Screening: (Low Dose CT Chest recommended if Age 73-80years, 30 pack-year currently smoking OR have quit w/in 15years.) does not qualify.   Lung Cancer Screening Referral: no  Additional Screening:  Hepatitis C Screening: does qualify; Completed 04/13/2010  Vision Screening: Recommended annual ophthalmology exams for early detection of glaucoma and other disorders of the eye. Is the patient up to date with their annual eye exam?  Yes  Who is the provider or what is the name of the office in which the patient attends annual eye exams[facility] provider, would they like to be referred to a provider to establish care? No .   Dental Screening: Recommended annual dental exams for proper oral hygiene  Community Resource Referral / Chronic Care Management: CRR required this visit?  No   CCM required this visit?  No       Plan:     I have personally reviewed and noted the following in the patient's chart:   Medical and social history Use of alcohol, tobacco or illicit drugs  Current medications and supplements including opioid prescriptions. Patient is not currently taking opioid prescriptions. Functional ability and status Nutritional status Physical activity Advanced directives List of other physicians Hospitalizations, surgeries, and ER visits in previous 12 months Vitals Screenings to include cognitive, depression, and falls Referrals and appointments  In addition, I have reviewed and discussed with patient certain preventive protocols, quality metrics, and best practice recommendations. A written personalized care plan for preventive services as well as general preventive health recommendations were provided to patient.     NKellie Simmering LPN   96/05/2093  Nurse Notes: none  Due to this being a virtual visit, the after visit summary with patients personalized plan was offered to patient via mail or my-chart. Patient would like to access on my-chart

## 2022-07-06 NOTE — Patient Instructions (Signed)
Lindsay Cook , Thank you for taking time to come for your Medicare Wellness Visit. I appreciate your ongoing commitment to your health goals. Please review the following plan we discussed and let me know if I can assist you in the future.   Screening recommendations/referrals: Colonoscopy: completed 2021, due 2026 Mammogram: scheduled for 07/19/2022 Bone Density: completed 06/02/2020 Recommended yearly ophthalmology/optometry visit for glaucoma screening and checkup Recommended yearly dental visit for hygiene and checkup  Vaccinations: Influenza vaccine: due Pneumococcal vaccine: completed 2/29/2021 Tdap vaccine: due Shingles vaccine: discussed   Covid-19: 09/06/2021, 05/22/2021, 07/06/2020, 01/04/2020, 12/10/2019  Advanced directives: Please bring a copy of your POA (Power of Attorney) and/or Living Will to your next appointment.   Conditions/risks identified: none  Next appointment: Follow up in one year for your annual wellness visit    Preventive Care 65 Years and Older, Female Preventive care refers to lifestyle choices and visits with your health care provider that can promote health and wellness. What does preventive care include? A yearly physical exam. This is also called an annual well check. Dental exams once or twice a year. Routine eye exams. Ask your health care provider how often you should have your eyes checked. Personal lifestyle choices, including: Daily care of your teeth and gums. Regular physical activity. Eating a healthy diet. Avoiding tobacco and drug use. Limiting alcohol use. Practicing safe sex. Taking low-dose aspirin every day. Taking vitamin and mineral supplements as recommended by your health care provider. What happens during an annual well check? The services and screenings done by your health care provider during your annual well check will depend on your age, overall health, lifestyle risk factors, and family history of disease. Counseling  Your  health care provider may ask you questions about your: Alcohol use. Tobacco use. Drug use. Emotional well-being. Home and relationship well-being. Sexual activity. Eating habits. History of falls. Memory and ability to understand (cognition). Work and work Statistician. Reproductive health. Screening  You may have the following tests or measurements: Height, weight, and BMI. Blood pressure. Lipid and cholesterol levels. These may be checked every 5 years, or more frequently if you are over 65 years old. Skin check. Lung cancer screening. You may have this screening every year starting at age 53 if you have a 30-pack-year history of smoking and currently smoke or have quit within the past 15 years. Fecal occult blood test (FOBT) of the stool. You may have this test every year starting at age 17. Flexible sigmoidoscopy or colonoscopy. You may have a sigmoidoscopy every 5 years or a colonoscopy every 10 years starting at age 24. Hepatitis C blood test. Hepatitis B blood test. Sexually transmitted disease (STD) testing. Diabetes screening. This is done by checking your blood sugar (glucose) after you have not eaten for a while (fasting). You may have this done every 1-3 years. Bone density scan. This is done to screen for osteoporosis. You may have this done starting at age 23. Mammogram. This may be done every 1-2 years. Talk to your health care provider about how often you should have regular mammograms. Talk with your health care provider about your test results, treatment options, and if necessary, the need for more tests. Vaccines  Your health care provider may recommend certain vaccines, such as: Influenza vaccine. This is recommended every year. Tetanus, diphtheria, and acellular pertussis (Tdap, Td) vaccine. You may need a Td booster every 10 years. Zoster vaccine. You may need this after age 10. Pneumococcal 13-valent conjugate (PCV13) vaccine. One  dose is recommended after age  21. Pneumococcal polysaccharide (PPSV23) vaccine. One dose is recommended after age 20. Talk to your health care provider about which screenings and vaccines you need and how often you need them. This information is not intended to replace advice given to you by your health care provider. Make sure you discuss any questions you have with your health care provider. Document Released: 11/18/2015 Document Revised: 07/11/2016 Document Reviewed: 08/23/2015 Elsevier Interactive Patient Education  2017 [place] Prevention in the Home Falls can cause injuries. They can happen to people of all ages. There are many things you can do to make your home safe and to help prevent falls. What can I do on the outside of my home? Regularly fix the edges of walkways and driveways and fix any cracks. Remove anything that might make you trip as you walk through a door, such as a raised step or threshold. Trim any bushes or trees on the path to your home. Use bright outdoor lighting. Clear any walking paths of anything that might make someone trip, such as rocks or tools. Regularly check to see if handrails are loose or broken. Make sure that both sides of any steps have handrails. Any raised decks and porches should have guardrails on the edges. Have any leaves, snow, or ice cleared regularly. Use sand or salt on walking paths during winter. Clean up any spills in your garage right away. This includes oil or grease spills. What can I do in the bathroom? Use night lights. Install grab bars by the toilet and in the tub and shower. Do not use towel bars as grab bars. Use non-skid mats or decals in the tub or shower. If you need to sit down in the shower, use a plastic, non-slip stool. Keep the floor dry. Clean up any water that spills on the floor as soon as it happens. Remove soap buildup in the tub or shower regularly. Attach bath mats securely with double-sided non-slip rug tape. Do not have throw  rugs and other things on the floor that can make you trip. What can I do in the bedroom? Use night lights. Make sure that you have a light by your bed that is easy to reach. Do not use any sheets or blankets that are too big for your bed. They should not hang down onto the floor. Have a firm chair that has side arms. You can use this for support while you get dressed. Do not have throw rugs and other things on the floor that can make you trip. What can I do in the kitchen? Clean up any spills right away. Avoid walking on wet floors. Keep items that you use a lot in easy-to-reach places. If you need to reach something above you, use a strong step stool that has a grab bar. Keep electrical cords out of the way. Do not use floor polish or wax that makes floors slippery. If you must use wax, use non-skid floor wax. Do not have throw rugs and other things on the floor that can make you trip. What can I do with my stairs? Do not leave any items on the stairs. Make sure that there are handrails on both sides of the stairs and use them. Fix handrails that are broken or loose. Make sure that handrails are as long as the stairways. Check any carpeting to make sure that it is firmly attached to the stairs. Fix any carpet that is loose or worn.  Avoid having throw rugs at the top or bottom of the stairs. If you do have throw rugs, attach them to the floor with carpet tape. Make sure that you have a light switch at the top of the stairs and the bottom of the stairs. If you do not have them, ask someone to add them for you. What else can I do to help prevent falls? Wear shoes that: Do not have high heels. Have rubber bottoms. Are comfortable and fit you well. Are closed at the toe. Do not wear sandals. If you use a stepladder: Make sure that it is fully opened. Do not climb a closed stepladder. Make sure that both sides of the stepladder are locked into place. Ask someone to hold it for you, if  possible. Clearly mark and make sure that you can see: Any grab bars or handrails. First and last steps. Where the edge of each step is. Use tools that help you move around (mobility aids) if they are needed. These include: Canes. Walkers. Scooters. Crutches. Turn on the lights when you go into a dark area. Replace any light bulbs as soon as they burn out. Set up your furniture so you have a clear path. Avoid moving your furniture around. If any of your floors are uneven, fix them. If there are any pets around you, be aware of where they are. Review your medicines with your doctor. Some medicines can make you feel dizzy. This can increase your chance of falling. Ask your doctor what other things that you can do to help prevent falls. This information is not intended to replace advice given to you by your health care provider. Make sure you discuss any questions you have with your health care provider. Document Released: 08/18/2009 Document Revised: 03/29/2016 Document Reviewed: 11/26/2014 Elsevier Interactive Patient Education  2017 [employer].

## 2022-07-11 ENCOUNTER — Encounter: Payer: Self-pay | Admitting: Internal Medicine

## 2022-07-19 ENCOUNTER — Ambulatory Visit
Admission: RE | Admit: 2022-07-19 | Discharge: 2022-07-19 | Disposition: A | Discharge status: Home / Self Care | Payer: Medicare Other | Source: Ambulatory Visit | Attending: Medical | Admitting: Medical

## 2022-07-19 DIAGNOSIS — Z1231 Encounter for screening mammogram for malignant neoplasm of breast: Secondary | ICD-10-CM

## 2022-07-30 DIAGNOSIS — D472 Monoclonal gammopathy: Secondary | ICD-10-CM | POA: Diagnosis not present

## 2022-07-30 DIAGNOSIS — R6889 Other general symptoms and signs: Secondary | ICD-10-CM | POA: Diagnosis not present

## 2022-07-30 DIAGNOSIS — C880 Waldenstrom macroglobulinemia: Secondary | ICD-10-CM | POA: Diagnosis not present

## 2022-07-31 ENCOUNTER — Other Ambulatory Visit (INDEPENDENT_AMBULATORY_CARE_PROVIDER_SITE_OTHER): Payer: Medicare Other

## 2022-07-31 DIAGNOSIS — Z23 Encounter for immunization: Secondary | ICD-10-CM | POA: Diagnosis not present

## 2022-08-06 DIAGNOSIS — C880 Waldenstrom macroglobulinemia: Secondary | ICD-10-CM | POA: Diagnosis not present

## 2022-08-14 ENCOUNTER — Encounter: Payer: Self-pay | Admitting: Internal Medicine

## 2022-08-24 ENCOUNTER — Other Ambulatory Visit: Payer: Medicare Other

## 2022-08-27 ENCOUNTER — Telehealth: Payer: Self-pay | Admitting: Physician Assistant

## 2022-08-27 ENCOUNTER — Other Ambulatory Visit (INDEPENDENT_AMBULATORY_CARE_PROVIDER_SITE_OTHER): Payer: Medicare Other

## 2022-08-27 DIAGNOSIS — Z23 Encounter for immunization: Secondary | ICD-10-CM | POA: Diagnosis not present

## 2022-08-27 NOTE — Telephone Encounter (Signed)
Pt came in and dropped off labs orders. She is under the car of [facility] in [place]. Every 6 months she has to go to [place] just to have labs drawn. She was advised by [facility] that if agreeable by PCP she can now have labs drawn at PCP. They have given her kit with orders tubes and everything needed to drawn and forward blood to [place]. I am sending back all info to [facility] for review. Please advise if ok to draw here. Pt will do all the shipping. We will draw and give the tubes to her to ship. These labs need to be done every 6 months. Pt can be reach at [phone].  

## 2022-09-18 ENCOUNTER — Encounter: Payer: Self-pay | Admitting: Internal Medicine

## 2022-10-04 ENCOUNTER — Telehealth: Payer: Self-pay | Admitting: Nurse Practitioner

## 2022-10-04 NOTE — Telephone Encounter (Signed)
Pt came in and dropped of copies of lab orders. She is under the care of [facility] in [place]. Every 6 months she has to go to [place] to get labs drawn. She was advised by [facility] that if PCP was agreeable pt could have labs drawn here. They have provided pt with a kit that has everything pt needs to draw, store and mail sample to clinic in [place]. I have discussed with lab tech here at [facility] and they have ok's that they can draw. Sending back a copy of labs in folder to be signed off on to be placed in pt's chart. Called pt this am and advised pt that I received verbal ok from [facility] this AM and she scheduled an appt for 01/27/2022. Pt advised that unless clinic in [place] advises otherwise these will be done in 6 month intervals. I spoke to lab tech this am and no orders need to be place in epic. Placing copy of orders in folder.

## 2023-01-25 ENCOUNTER — Other Ambulatory Visit: Payer: Medicare Other

## 2023-01-28 ENCOUNTER — Other Ambulatory Visit: Payer: Medicare Other

## 2023-01-28 DIAGNOSIS — R6889 Other general symptoms and signs: Secondary | ICD-10-CM | POA: Diagnosis not present

## 2023-01-28 DIAGNOSIS — C9000 Multiple myeloma not having achieved remission: Secondary | ICD-10-CM | POA: Diagnosis not present

## 2023-02-18 DIAGNOSIS — C880 Waldenstrom macroglobulinemia: Secondary | ICD-10-CM | POA: Diagnosis not present

## 2023-04-22 ENCOUNTER — Ambulatory Visit (INDEPENDENT_AMBULATORY_CARE_PROVIDER_SITE_OTHER): Payer: Medicare Other | Admitting: Family Medicine

## 2023-04-22 ENCOUNTER — Encounter: Payer: Self-pay | Admitting: Family Medicine

## 2023-04-22 VITALS — BP 112/72 | HR 76 | Temp 98.5°F | Ht 63.0 in | Wt 170.4 lb

## 2023-04-22 DIAGNOSIS — L237 Allergic contact dermatitis due to plants, except food: Secondary | ICD-10-CM | POA: Diagnosis not present

## 2023-04-22 MED ORDER — TRIAMCINOLONE ACETONIDE 0.1 % EX CREA: TOPICAL_CREAM | CUTANEOUS | 0 refills | Status: DC

## 2023-04-22 NOTE — Patient Instructions (Addendum)
Use the triamcinolone cream to the affected area at the left ankle, twice daily. Don't put in on normal appearing skin--use small amount only to the affected areas. Use this for up to 2 weeks maximum. Never use it on your face. If you notice fever, increasing redness, pain, crusting or discolored drainage, this can indicate a secondary bacterial infection, and may need an antibiotic.  Re-evaluation will be needed if getting worse.  Do not use the hydrocortisone cream while using the prescription steroid. If you need any of the benadryl cream or other itch lotions between the applications of the prescription cream, you can use these. You can also use an oral antihistamine, if needed (benadryl at bedtime, or allegra/claritin/zyrtec)

## 2023-04-22 NOTE — Progress Notes (Signed)
Chief Complaint  Patient presents with   Rash    Rash on left ankle. And some on right ankle. Going on week 2. Has tried benadryl cream, [place] cream, cortisone 10, Ivares and Aquaphor. Itchy rash. Used Aquaphor only today.    Around June 5-6 she started with a blistery rash at the left medial ankle. Around the same time she noticed a much smaller area on the right ankle.  She had been in her flower bed in her tennis shoes.  There is [place] in her yard coming from the neighbor's yard.   She had the same thing back in May--hadn't been wearing her gloves, and got it on her face, chest .  It resolved with OTC measures.  She is going on vacation soon, and the area at the ankle hasn't been improving with OTC measures. It is intermittently itchy. She started topical benadryl gel yesterday, which has been helping.  PMH, PSH, SH reviewed  Outpatient Encounter Medications as of 04/22/2023  Medication Sig Note   diphenhydrAMINE (BENADRYL) 25 MG tablet Take 25 mg by mouth every 6 (six) hours as needed. 04/22/2023: Took 1 last night   triamcinolone cream (KENALOG) 0.1 % Apply sparingly to the affected area of skin twice daily as needed. Do not use longer than 2 weeks    VITAMIN D PO Take 5,000 Int'l Units/L by mouth daily.    [DISCONTINUED] methylPREDNISolone (MEDROL DOSEPAK) 4 MG TBPK tablet Take as directed (Patient not taking: Reported on 07/06/2022)    [DISCONTINUED] neomycin-polymyxin-dexameth (MAXITROL) 0.1 % OINT Place 1 application. into the right eye 3 (three) times daily. (Patient not taking: Reported on 07/06/2022)    No facility-administered encounter medications on file as of 04/22/2023.   NOT using TAC prior to today's visit  Allergies  Allergen Reactions   Prednisone     Can't Tolerate     ROS: no fever, chills, n/v/d. No URI symptoms, shortness of breath. No joint pains. No other concerns.   PHYSICAL EXAM:  BP 112/72   Pulse 76   Temp 98.5 F (36.9 C)   Ht 5\' 3"   (1.6 m)   Wt 170 lb 6.4 oz (77.3 kg)   LMP 11/05/2002 (Approximate)   BMI 30.19 kg/m   Pleasant, well-appearing female in no distress  L medial ankle: 5.5 x 7cm area that is slightly pink/hyperpigmented, many small vesicles. No crusting, warmth, streaks. Nontender.  RLE--a few scattered papules on the medial calf, and one larger one at the medial ankle. 2+ pulses, no edema.   ASSESSMENT/PLAN:  Allergic contact dermatitis due to plants, except food - TAC sparingly BID for up to 2 weeks. Discussed avoidance, and s/sx bacterial infection. Supportive measures reviewed - Plan: triamcinolone cream (KENALOG) 0.1 %   Use the triamcinolone cream to the affected area at the left ankle, twice daily. Don't put in on normal appearing skin--use small amount only to the affected areas. Use this for up to 2 weeks maximum. Never use it on your face. If you notice fever, increasing redness, pain, crusting or discolored drainage, this can indicate a secondary bacterial infection, and may need an antibiotic.  Re-evaluation will be needed if getting worse.  Do not use the hydrocortisone cream while using the prescription steroid. If you need any of the benadryl cream or other itch lotions between the applications of the prescription cream, you can use these. You can also use an oral antihistamine, if needed (benadryl at bedtime, or allegra/claritin/zyrtec)

## 2023-06-04 DIAGNOSIS — M25532 Pain in left wrist: Secondary | ICD-10-CM | POA: Diagnosis not present

## 2023-06-07 DIAGNOSIS — M25532 Pain in left wrist: Secondary | ICD-10-CM | POA: Diagnosis not present

## 2023-06-07 DIAGNOSIS — W5501XA Bitten by cat, initial encounter: Secondary | ICD-10-CM | POA: Diagnosis not present

## 2023-06-13 DIAGNOSIS — M25532 Pain in left wrist: Secondary | ICD-10-CM | POA: Diagnosis not present

## 2023-06-26 DIAGNOSIS — H2513 Age-related nuclear cataract, bilateral: Secondary | ICD-10-CM | POA: Diagnosis not present

## 2023-06-26 DIAGNOSIS — H5203 Hypermetropia, bilateral: Secondary | ICD-10-CM | POA: Diagnosis not present

## 2023-06-27 ENCOUNTER — Other Ambulatory Visit: Payer: Self-pay | Admitting: Medical

## 2023-06-27 DIAGNOSIS — Z1231 Encounter for screening mammogram for malignant neoplasm of breast: Secondary | ICD-10-CM

## 2023-07-30 ENCOUNTER — Ambulatory Visit: Payer: Medicare Other

## 2023-07-30 DIAGNOSIS — Z0000 Encounter for general adult medical examination without abnormal findings: Secondary | ICD-10-CM

## 2023-07-30 NOTE — Progress Notes (Signed)
Subjective:   Lindsay Cook is a 72 y.o. female who presents for Medicare Annual (Subsequent) preventive examination.  Visit Complete: Virtual  I connected with  Lindsay Cook on 07/30/23 by a audio enabled telemedicine application and verified that I am speaking with the correct person using two identifiers.  Patient Location: Home  Provider Location: Office/Clinic  I discussed the limitations of evaluation and management by telemedicine. The patient expressed understanding and agreed to proceed.  Vital Signs: Unable to obtain new vitals due to this being a telehealth visit.  Cardiac Risk Factors include: advanced age (>79men, >23 women)     Objective:    Today's Vitals   There is no height or weight on file to calculate BMI.     07/30/2023    8:47 AM 07/06/2022    9:01 AM 05/31/2020   12:35 PM 03/02/2013    2:04 PM 03/02/2013    7:17 AM  Advanced Directives  Does Patient Have a Medical Advance Directive? Yes Yes Yes Patient has advance directive, copy not in chart Patient has advance directive, copy not in chart  Type of Advance Directive Healthcare Power of [place];Living will Healthcare Power of [place];Living will Healthcare Power of [place];Living will Living will Living will  Copy of Healthcare Power of Attorney in Chart? No - copy requested No - copy requested  Copy requested from family   Pre-existing out of facility DNR order (yellow form or pink MOST form)    No No    Current Medications (verified) Outpatient Encounter Medications as of 07/30/2023  Medication Sig   diphenhydrAMINE (BENADRYL) 25 MG tablet Take 25 mg by mouth every 6 (six) hours as needed.   triamcinolone cream (KENALOG) 0.1 % Apply sparingly to the affected area of skin twice daily as needed. Do not use longer than 2 weeks   VITAMIN D PO Take 5,000 Int'l Units/L by mouth daily.   No facility-administered encounter medications on file as of 07/30/2023.    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 Karena Addison, [place], [place], due as of 05/2020   HERNIA REPAIR     2014   INSERTION OF MESH N/A 03/02/2013   Procedure: INSERTION OF MESH;  Surgeon: Adolph Pollack, MD;  Location: [facility];  Service: General;  Laterality: N/A;   VENTRAL HERNIA REPAIR N/A 03/02/2013   Procedure: LAPAROSCOPIC VENTRAL HERNIA;  Surgeon: Adolph Pollack, MD;  Location: [facility];  Service: General;  Laterality: N/A;   Family History  Problem Relation Age of Onset   Cancer Father        lung/brain   Cancer Paternal Aunt        breast   Breast cancer Maternal Aunt    Social History   Socioeconomic History   Marital status: Widowed    Spouse name: Not on file   Number of children: Not on file   Years of education: Not on file   Highest education level: Not on file  Occupational History   Not on file  Tobacco Use   Smoking status: Never   Smokeless tobacco: Never  Vaping Use   Vaping  status: Never Used  Substance and Sexual Activity   Alcohol use: Yes    Comment: occasional beer   Drug use: No   Sexual activity: Never    Birth control/protection: Abstinence, Post-menopausal  Other Topics Concern   Not on file  Social History Narrative   Lives with her cat.  No children.  Former IT consultant.  Exercise - walks 3-4 miles daily.  30 wall pushups.   05/2020.   Social Determinants of Health   Financial Resource Strain: Low Risk  (07/30/2023)   Overall Financial Resource Strain (CARDIA)    Difficulty of Paying Living Expenses: Not hard at all  Food Insecurity: No Food Insecurity (07/30/2023)   Hunger Vital Sign    Worried About Running Out of Food in the Last  Year: Never true    Ran Out of Food in the Last Year: Never true  Transportation Needs: No Transportation Needs (07/30/2023)   PRAPARE - Administrator, Civil Service (Medical): No    Lack of Transportation (Non-Medical): No  Physical Activity: Sufficiently Active (07/30/2023)   Exercise Vital Sign    Days of Exercise per Week: 5 days    Minutes of Exercise per Session: 50 min  Stress: No Stress Concern Present (07/30/2023)   [employer] of Occupational Health - Occupational Stress Questionnaire    Feeling of Stress : Not at all  Social Connections: Moderately Isolated (07/30/2023)   Social Connection and Isolation Panel [NHANES]    Frequency of Communication with Friends and Family: More than three times a week    Frequency of Social Gatherings with Friends and Family: Three times a week    Attends Religious Services: More than 4 times per year    Active Member of Clubs or Organizations: No    Attends Banker Meetings: Never    Marital Status: Widowed    Tobacco Counseling Counseling given: Not Answered   Clinical Intake:  Pre-visit preparation completed: Yes  Pain : No/denies pain     Nutritional Risks: None Diabetes: No  How often do you need to have someone help you when you read instructions, pamphlets, or other written materials from your doctor or pharmacy?: 1 - Never  Interpreter Needed?: No  Information entered by :: NAllen LPN   Activities of Daily Living    07/30/2023    8:42 AM  In your present state of health, do you have any difficulty performing the following activities:  Hearing? 0  Vision? 0  Difficulty concentrating or making decisions? 0  Walking or climbing stairs? 0  Dressing or bathing? 0  Doing errands, shopping? 0  Preparing Food and eating ? N  Using the Toilet? N  In the past six months, have you accidently leaked urine? N  Do you have problems with loss of bowel control? N  Managing your Medications? N   Managing your Finances? N  Housekeeping or managing your Housekeeping? N    Patient Care Team: Early, Sung Amabile, NP as PCP - General (Nurse Practitioner) [place], [facility]  Indicate any recent Medical Services you may have received from other than [facility] providers in the past year (date may be approximate).     Assessment:   This is a routine wellness examination for Latoyya.  Hearing/Vision screen Hearing Screening - Comments:: Denies hearing issues Vision Screening - Comments:: Regular eye exams, [facility]   Goals Addressed             This Visit's Progress  Patient Stated       07/30/2023, stay health and motivated       Depression Screen    07/30/2023    8:50 AM 07/06/2022    9:02 AM 02/21/2021   10:36 AM 05/31/2020   12:36 PM 04/29/2018    4:00 PM  PHQ 2/9 Scores  PHQ - 2 Score 0 0 0 0 0  PHQ- 9 Score 0 0       Fall Risk    07/30/2023    8:49 AM 07/06/2022    9:01 AM 02/21/2021   10:36 AM 05/31/2020   12:35 PM 04/29/2018    4:00 PM  Fall Risk   Falls in the past year? 0 1 0 1 No  Comment  tripped over rug     Number falls in past yr: 0 0 0 0   Injury with Fall? 0 0 0 1   Risk for fall due to : Medication side effect No Fall Risks No Fall Risks    Follow up Falls prevention discussed;Falls evaluation completed Falls prevention discussed;Education provided;Falls evaluation completed Falls evaluation completed      MEDICARE RISK AT HOME: Medicare Risk at Home Any stairs in or around the home?: Yes If so, are there any without handrails?: No Home free of loose throw rugs in walkways, pet beds, electrical cords, etc?: Yes Adequate lighting in your home to reduce risk of falls?: Yes Life alert?: No Use of a cane, walker or w/c?: No Grab bars in the bathroom?: No Shower chair or bench in shower?: Yes Elevated toilet seat or a handicapped toilet?: Yes  TIMED UP AND GO:  Was the test performed?  No    Cognitive Function:         07/30/2023    8:51 AM 07/06/2022    9:04 AM  6CIT Screen  What Year? 0 points 0 points  What month? 0 points 0 points  What time? 0 points 0 points  Count back from 20 0 points 0 points  Months in reverse 0 points 0 points  Repeat phrase 2 points 2 points  Total Score 2 points 2 points    Immunizations Immunization History  Administered Date(s) Administered   Fluad Quad(high Dose 65+) 08/18/2020, 08/17/2021, 07/31/2022   Influenza, High Dose Seasonal PF 07/25/2016, 08/08/2017, 08/14/2018   Influenza,inj,Quad PF,6+ Mos 09/20/2014   Influenza-Unspecified 09/15/2013   PFIZER Comirnaty(Gray Top)Covid-19 Tri-Sucrose Vaccine 05/22/2021   PFIZER(Purple Top)SARS-COV-2 Vaccination 12/10/2019, 01/04/2020, 07/06/2020   Pfizer Covid-19 Vaccine Bivalent Booster 77yrs & up 09/06/2021   Pfizer(Comirnaty)Fall Seasonal Vaccine 12 years and older 08/27/2022   Pneumococcal Conjugate-13 09/20/2014   Pneumococcal Polysaccharide-23 01/03/2015    TDAP status: Due, Education has been provided regarding the importance of this vaccine. Advised may receive this vaccine at local pharmacy or Health Dept. Aware to provide a copy of the vaccination record if obtained from local pharmacy or Health Dept. Verbalized acceptance and understanding.  Flu Vaccine status: Due, Education has been provided regarding the importance of this vaccine. Advised may receive this vaccine at local pharmacy or Health Dept. Aware to provide a copy of the vaccination record if obtained from local pharmacy or Health Dept. Verbalized acceptance and understanding.  Pneumococcal vaccine status: Up to date  Covid-19 vaccine status: Information provided on how to obtain vaccines.   Qualifies for Shingles Vaccine? Yes   Zostavax completed No   Shingrix Completed?: No.    Education has been provided regarding the importance of this vaccine. Patient has been  advised to call insurance company to determine out of pocket expense if they have not  yet received this vaccine. Advised may also receive vaccine at local pharmacy or Health Dept. Verbalized acceptance and understanding.  Screening Tests Health Maintenance  Topic Date Due   DTaP/Tdap/Td (1 - Tdap) Never done   Zoster Vaccines- Shingrix (1 of 2) Never done   INFLUENZA VACCINE  06/06/2023   COVID-19 Vaccine (7 - 2023-24 season) 07/07/2023   MAMMOGRAM  07/20/2023   Medicare Annual Wellness (AWV)  07/29/2024   Colonoscopy  09/20/2025   Pneumonia Vaccine 38+ Years old  Completed   DEXA SCAN  Completed   Hepatitis C Screening  Completed   HPV VACCINES  Aged Out    Health Maintenance  Health Maintenance Due  Topic Date Due   DTaP/Tdap/Td (1 - Tdap) Never done   Zoster Vaccines- Shingrix (1 of 2) Never done   INFLUENZA VACCINE  06/06/2023   COVID-19 Vaccine (7 - 2023-24 season) 07/07/2023   MAMMOGRAM  07/20/2023    Colorectal cancer screening: Type of screening: Colonoscopy. Completed 2021. Repeat every 5 years  Mammogram status: scheduled for 08/21/2023  Bone Density status: Completed 06/02/2020.   Lung Cancer Screening: (Low Dose CT Chest recommended if Age 67-80 years, 20 pack-year currently smoking OR have quit w/in 15years.) does not qualify.   Lung Cancer Screening Referral: no  Additional Screening:  Hepatitis C Screening: does qualify; Completed 04/13/2010  Vision Screening: Recommended annual ophthalmology exams for early detection of glaucoma and other disorders of the eye. Is the patient up to date with their annual eye exam?  Yes  Who is the provider or what is the name of the office in which the patient attends annual eye exams? [facility] If pt is not established with a provider, would they like to be referred to a provider to establish care? No .   Dental Screening: Recommended annual dental exams for proper oral hygiene  Diabetic Foot Exam: n/a  Community Resource Referral / Chronic Care Management: CRR required this visit?  No   CCM  required this visit?  No     Plan:     I have personally reviewed and noted the following in the patient's chart:   Medical and social history Use of alcohol, tobacco or illicit drugs  Current medications and supplements including opioid prescriptions. Patient is not currently taking opioid prescriptions. Functional ability and status Nutritional status Physical activity Advanced directives List of other physicians Hospitalizations, surgeries, and ER visits in previous 12 months Vitals Screenings to include cognitive, depression, and falls Referrals and appointments  In addition, I have reviewed and discussed with patient certain preventive protocols, quality metrics, and best practice recommendations. A written personalized care plan for preventive services as well as general preventive health recommendations were provided to patient.     Barb Merino, LPN   9/81/1914   After Visit Summary: (MyChart) Due to this being a telephonic visit, the after visit summary with patients personalized plan was offered to patient via MyChart   Nurse Notes: none

## 2023-07-30 NOTE — Patient Instructions (Signed)
Lindsay Cook , Thank you for taking time to come for your Medicare Wellness Visit. I appreciate your ongoing commitment to your health goals. Please review the following plan we discussed and let me know if I can assist you in the future.   Referrals/Orders/Follow-Ups/Clinician Recommendations: none  This is a list of the screening recommended for you and due dates:  Health Maintenance  Topic Date Due   DTaP/Tdap/Td vaccine (1 - Tdap) Never done   Zoster (Shingles) Vaccine (1 of 2) Never done   Flu Shot  06/06/2023   COVID-19 Vaccine (7 - 2023-24 season) 07/07/2023   Mammogram  07/20/2023   Medicare Annual Wellness Visit  07/29/2024   Colon Cancer Screening  09/20/2025   Pneumonia Vaccine  Completed   DEXA scan (bone density measurement)  Completed   Hepatitis C Screening  Completed   HPV Vaccine  Aged Out    Advanced directives: (Copy Requested) Please bring a copy of your health care power of attorney and living will to the office to be added to your chart at your convenience.  Next Medicare Annual Wellness Visit scheduled for next year: Yes  Insert Preventive Care attachment Insert FALL PREVENTION attachment if needed

## 2023-08-07 ENCOUNTER — Ambulatory Visit: Payer: Medicare Other

## 2023-08-07 VITALS — BP 113/82 | HR 102 | Ht 63.0 in | Wt 169.0 lb

## 2023-08-07 DIAGNOSIS — Z23 Encounter for immunization: Secondary | ICD-10-CM | POA: Diagnosis not present

## 2023-08-07 NOTE — Progress Notes (Signed)
Patient presents to the [facility] for 2024-2025 Covid vaccination. She denies any feeling of sickness or exposure to Covid recently.   She received Pfizer Vaccination in Right Deltoid, without any complications. Lot#[id number]. She was observed for 15 minutes, no signs of adverse reactions noticed.   Patient will return to [facility] for her flu vaccination in 2 weeks.

## 2023-08-19 ENCOUNTER — Ambulatory Visit: Payer: Medicare Other

## 2023-08-20 DIAGNOSIS — R6889 Other general symptoms and signs: Secondary | ICD-10-CM | POA: Diagnosis not present

## 2023-08-20 DIAGNOSIS — C8800 Waldenstrom macroglobulinemia not having achieved remission: Secondary | ICD-10-CM | POA: Diagnosis not present

## 2023-08-21 ENCOUNTER — Ambulatory Visit
Admission: RE | Admit: 2023-08-21 | Discharge: 2023-08-21 | Disposition: A | Discharge status: Home / Self Care | Payer: Medicare Other | Source: Ambulatory Visit | Attending: Medical | Admitting: Medical

## 2023-08-21 DIAGNOSIS — Z1231 Encounter for screening mammogram for malignant neoplasm of breast: Secondary | ICD-10-CM

## 2023-08-23 ENCOUNTER — Telehealth: Payer: Self-pay | Admitting: Medical

## 2023-08-23 NOTE — Progress Notes (Signed)
She needs a physical appointment.  I am not sure why attached to her mammogram result  Please get her on the schedule for a well visit

## 2023-08-23 NOTE — Telephone Encounter (Signed)
Called patient to schedule cpe and she says she does not want to schedule one at this time. She says she goes to an oncologist and they draw enough blood from her.

## 2023-09-12 DIAGNOSIS — C8800 Waldenstrom macroglobulinemia not having achieved remission: Secondary | ICD-10-CM | POA: Diagnosis not present

## 2023-09-12 DIAGNOSIS — C8590 Non-Hodgkin lymphoma, unspecified, unspecified site: Secondary | ICD-10-CM | POA: Diagnosis not present

## 2023-09-23 DIAGNOSIS — C8300 Small cell B-cell lymphoma, unspecified site: Secondary | ICD-10-CM | POA: Diagnosis not present

## 2023-10-02 DIAGNOSIS — Z23 Encounter for immunization: Secondary | ICD-10-CM | POA: Diagnosis not present

## 2023-11-11 ENCOUNTER — Ambulatory Visit: Payer: Medicare Other | Admitting: Nurse Practitioner

## 2023-11-21 DIAGNOSIS — R0981 Nasal congestion: Secondary | ICD-10-CM | POA: Diagnosis not present

## 2023-12-10 DIAGNOSIS — C8800 Waldenstrom macroglobulinemia not having achieved remission: Secondary | ICD-10-CM | POA: Diagnosis not present

## 2023-12-10 DIAGNOSIS — R6889 Other general symptoms and signs: Secondary | ICD-10-CM | POA: Diagnosis not present

## 2023-12-19 DIAGNOSIS — C8800 Waldenstrom macroglobulinemia not having achieved remission: Secondary | ICD-10-CM | POA: Diagnosis not present

## 2024-03-10 DIAGNOSIS — C8800 Waldenstrom macroglobulinemia not having achieved remission: Secondary | ICD-10-CM | POA: Diagnosis not present

## 2024-03-10 DIAGNOSIS — R6889 Other general symptoms and signs: Secondary | ICD-10-CM | POA: Diagnosis not present

## 2024-03-19 DIAGNOSIS — C8800 Waldenstrom macroglobulinemia not having achieved remission: Secondary | ICD-10-CM | POA: Diagnosis not present

## 2024-07-14 ENCOUNTER — Other Ambulatory Visit: Payer: Self-pay | Admitting: Medical

## 2024-07-14 ENCOUNTER — Other Ambulatory Visit: Payer: Self-pay | Admitting: Nurse Practitioner

## 2024-07-14 DIAGNOSIS — Z1231 Encounter for screening mammogram for malignant neoplasm of breast: Secondary | ICD-10-CM

## 2024-07-21 DIAGNOSIS — D6489 Other specified anemias: Secondary | ICD-10-CM | POA: Diagnosis not present

## 2024-07-21 DIAGNOSIS — D472 Monoclonal gammopathy: Secondary | ICD-10-CM | POA: Diagnosis not present

## 2024-07-21 DIAGNOSIS — C8800 Waldenstrom macroglobulinemia not having achieved remission: Secondary | ICD-10-CM | POA: Diagnosis not present

## 2024-07-27 ENCOUNTER — Ambulatory Visit (INDEPENDENT_AMBULATORY_CARE_PROVIDER_SITE_OTHER): Admitting: Nurse Practitioner

## 2024-07-27 DIAGNOSIS — D519 Vitamin B12 deficiency anemia, unspecified: Secondary | ICD-10-CM

## 2024-07-27 MED ORDER — CYANOCOBALAMIN 1000 MCG/ML IJ SOLN
1000.0000 ug | Freq: Once | INTRAMUSCULAR | Status: AC
  Administered 2024-07-27: 1000 ug via INTRAMUSCULAR

## 2024-07-29 ENCOUNTER — Ambulatory Visit: Admitting: Family Medicine

## 2024-07-30 ENCOUNTER — Telehealth: Payer: Self-pay

## 2024-07-30 DIAGNOSIS — C8800 Waldenstrom macroglobulinemia not having achieved remission: Secondary | ICD-10-CM | POA: Diagnosis not present

## 2024-07-30 DIAGNOSIS — D518 Other vitamin B12 deficiency anemias: Secondary | ICD-10-CM | POA: Diagnosis not present

## 2024-07-30 NOTE — Telephone Encounter (Signed)
 Copied from CRM [id number]. Topic: General - Other >> Jul 30, 2024 11:24 AM Debby BROCKS wrote: Reason for CRM: Melissa from Dr. Kelsey office called stating that the Dr. Lorelee to place the patient on weekly B12 shots instead of every month. She would like the message sent to PCP and care team to see if this is possible  Callback:[phone] for today [phone] and ask for [facility] for any other day

## 2024-07-31 NOTE — Telephone Encounter (Unsigned)
 Copied from CRM #[id number]. Topic: Clinical - Medication Question >> Jul 31, 2024  8:56 AM Everette C wrote: Reason for CRM: The patient has made an additional phone call to follow up on a previous request from their oncologists office for possible weekly B12 injections. Please contact the patient to discuss further if/when possible

## 2024-07-31 NOTE — Telephone Encounter (Signed)
 Are they asking if she can come here weekly to have this done or if I will write the script and monitor?  Im not sure who started her on B12. If they feel that she needs the B12 weekly then that is at their discretion.   I have never seen the patient (it's been over a year since she was seen by anyone in the office). If they need me to write the order and monitor She will need to be seen.

## 2024-08-03 ENCOUNTER — Telehealth: Payer: Self-pay | Admitting: Nurse Practitioner

## 2024-08-03 NOTE — Telephone Encounter (Signed)
 Change B12  injections weekly stating monthly is not enough boost for her and pt states we received information from oncologist last Thursday  ? Ok to schedule B 12 injections weekly

## 2024-08-03 NOTE — Progress Notes (Signed)
 Lab visit only.

## 2024-08-04 NOTE — Telephone Encounter (Signed)
 OK to do B12 1000mcg once a week x 4 weeks then repeat labs 1 week after last injection.   Please call Melissa with Dr. Kelsey office to determine how long they want the weekly injection and what parameters they are looking for in response.

## 2024-08-05 ENCOUNTER — Other Ambulatory Visit (INDEPENDENT_AMBULATORY_CARE_PROVIDER_SITE_OTHER)

## 2024-08-05 DIAGNOSIS — D519 Vitamin B12 deficiency anemia, unspecified: Secondary | ICD-10-CM | POA: Diagnosis not present

## 2024-08-06 MED ORDER — CYANOCOBALAMIN 1000 MCG/ML IJ SOLN
1000.0000 ug | Freq: Once | INTRAMUSCULAR | Status: AC
  Administered 2024-08-05: 1000 ug via INTRAMUSCULAR

## 2024-08-11 ENCOUNTER — Ambulatory Visit: Payer: Medicare Other

## 2024-08-11 VITALS — Ht 63.0 in | Wt 168.0 lb

## 2024-08-11 DIAGNOSIS — Z0000 Encounter for general adult medical examination without abnormal findings: Secondary | ICD-10-CM

## 2024-08-11 NOTE — Patient Instructions (Signed)
 Lindsay Cook,  Thank you for taking the time for your Medicare Wellness Visit. I appreciate your continued commitment to your health goals. Please review the care plan we discussed, and feel free to reach out if I can assist you further.  Medicare recommends these wellness visits once per year to help you and your care team stay ahead of potential health issues. These visits are designed to focus on prevention, allowing your provider to concentrate on managing your acute and chronic conditions during your regular appointments.  Please note that Annual Wellness Visits do not include a physical exam. Some assessments may be limited, especially if the visit was conducted virtually. If needed, we may recommend a separate in-person follow-up with your provider.  Ongoing Care Seeing your primary care provider every 3 to 6 months helps us  monitor your health and provide consistent, personalized care.   Referrals If a referral was made during today's visit and you haven't received any updates within two weeks, please contact the referred provider directly to check on the status.  Recommended Screenings:  Health Maintenance  Topic Date Due   DTaP/Tdap/Td vaccine (1 - Tdap) Never done   Zoster (Shingles) Vaccine (1 of 2) Never done   Flu Shot  06/05/2024   COVID-19 Vaccine (8 - Pfizer risk 2024-25 season) 07/06/2024   Breast Cancer Screening  08/20/2024   Medicare Annual Wellness Visit  08/11/2025   Colon Cancer Screening  09/20/2025   Pneumococcal Vaccine for age over 37  Completed   DEXA scan (bone density measurement)  Completed   Hepatitis C Screening  Completed   Meningitis B Vaccine  Aged Out       08/11/2024    8:36 AM  Advanced Directives  Does Patient Have a Medical Advance Directive? Yes  Type of Estate agent of [place];Living will  Copy of Healthcare Power of Attorney in Chart? No - copy requested   Advance Care Planning is important because  it: Ensures you receive medical care that aligns with your values, goals, and preferences. Provides guidance to your family and loved ones, reducing the emotional burden of decision-making during critical moments.  Vision: Annual vision screenings are recommended for early detection of glaucoma, cataracts, and diabetic retinopathy. These exams can also reveal signs of chronic conditions such as diabetes and high blood pressure.  Dental: Annual dental screenings help detect early signs of oral cancer, gum disease, and other conditions linked to overall health, including heart disease and diabetes.  Please see the attached documents for additional preventive care recommendations.

## 2024-08-11 NOTE — Progress Notes (Signed)
 Subjective:   Lindsay Cook is a 73 y.o. who presents for a Medicare Wellness preventive visit.  As a reminder, Annual Wellness Visits don't include a physical exam, and some assessments may be limited, especially if this visit is performed virtually. We may recommend an in-person follow-up visit with your provider if needed.  Visit Complete: Virtual I connected with  Lindsay Cook on 08/11/24 by a audio enabled telemedicine application and verified that I am speaking with the correct person using two identifiers.  Patient Location: Home  Provider Location: Office/Clinic  I discussed the limitations of evaluation and management by telemedicine. The patient expressed understanding and agreed to proceed.  Vital Signs: Because this visit was a virtual/telehealth visit, some criteria may be missing or patient reported. Any vitals not documented were not able to be obtained and vitals that have been documented are patient reported.  VideoError- Librarian, academic were attempted between this provider and patient, however failed, due to patient having technical difficulties OR patient did not have access to video capability.  We continued and completed visit with audio only.   Persons Participating in Visit: Patient.  AWV Questionnaire: No: Patient Medicare AWV questionnaire was not completed prior to this visit.  Cardiac Risk Factors include: advanced age (>23men, >19 women)     Objective:    Today's Vitals   08/11/24 0830  Weight: 168 lb (76.2 kg)  Height: 5' 3 (1.6 m)   Body mass index is 29.76 kg/m.     08/11/2024    8:36 AM 07/30/2023    8:47 AM 07/06/2022    9:01 AM 05/31/2020   12:35 PM 03/02/2013    2:04 PM 03/02/2013    7:17 AM  Advanced Directives  Does Patient Have a Medical Advance Directive? Yes Yes Yes Yes Patient has advance directive, copy not in chart  Patient has advance directive, copy not in chart   Type of  Advance Directive Healthcare Power of [place];Living will Healthcare Power of [place];Living will Healthcare Power of [place];Living will Healthcare Power of [place];Living will Living will  Living will   Copy of Healthcare Power of Attorney in Chart? No - copy requested No - copy requested No - copy requested  Copy requested from family    Pre-existing out of facility DNR order (yellow form or pink MOST form)     No  No      Data saved with a previous flowsheet row definition    Current Medications (verified) Outpatient Encounter Medications as of 08/11/2024  Medication Sig   diphenhydrAMINE (BENADRYL) 25 MG tablet Take 25 mg by mouth every 6 (six) hours as needed.   VITAMIN D PO Take 5,000 Int'l Units/L by mouth daily.   triamcinolone  cream (KENALOG ) 0.1 % Apply sparingly to the affected area of skin twice daily as needed. Do not use longer than 2 weeks (Patient not taking: Reported on 08/11/2024)   No facility-administered encounter medications on file as of 08/11/2024.    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 Ronal Ferraris, [facility], [place], due as of 05/2020   HERNIA REPAIR     2014  INSERTION OF MESH N/A 03/02/2013   Procedure: INSERTION OF MESH;  Surgeon: Krystal JINNY Russell, MD;  Location: [facility];  Service: General;  Laterality: N/A;   VENTRAL HERNIA REPAIR N/A 03/02/2013   Procedure: LAPAROSCOPIC VENTRAL HERNIA;  Surgeon: Krystal JINNY Russell, MD;  Location: [facility];  Service: General;  Laterality: N/A;   Family History  Problem Relation Age of Onset   Cancer Father        lung/brain   Cancer Paternal Aunt        breast   Breast cancer Maternal Aunt    Social  History   Socioeconomic History   Marital status: Widowed    Spouse name: Not on file   Number of children: Not on file   Years of education: Not on file   Highest education level: Not on file  Occupational History   Not on file  Tobacco Use   Smoking status: Never   Smokeless tobacco: Never  Vaping Use   Vaping status: Never Used  Substance and Sexual Activity   Alcohol use: Yes    Comment: occasional beer   Drug use: No   Sexual activity: Never    Birth control/protection: Abstinence, Post-menopausal  Other Topics Concern   Not on file  Social History Narrative   Lives with her cat.  No children.  Former IT consultant.  Exercise - walks 3-4 miles daily.  30 wall pushups.   05/2020.   Social Drivers of Corporate investment banker Strain: Low Risk  (08/11/2024)   Overall Financial Resource Strain (CARDIA)    Difficulty of Paying Living Expenses: Not hard at all  Food Insecurity: No Food Insecurity (08/11/2024)   Hunger Vital Sign    Worried About Running Out of Food in the Last Year: Never true    Ran Out of Food in the Last Year: Never true  Transportation Needs: No Transportation Needs (08/11/2024)   PRAPARE - Administrator, Civil Service (Medical): No    Lack of Transportation (Non-Medical): No  Physical Activity: Sufficiently Active (08/11/2024)   Exercise Vital Sign    Days of Exercise per Week: 5 days    Minutes of Exercise per Session: 50 min  Stress: No Stress Concern Present (08/11/2024)   [employer] of Occupational Health - Occupational Stress Questionnaire    Feeling of Stress: Not at all  Social Connections: Socially Isolated (08/11/2024)   Social Connection and Isolation Panel    Frequency of Communication with Friends and Family: More than three times a week    Frequency of Social Gatherings with Friends and Family: More than three times a week    Attends Religious Services: Never    Database administrator or Organizations: No    Attends Occupational hygienist Meetings: Never    Marital Status: Widowed    Tobacco Counseling Counseling given: Not Answered    Clinical Intake:  Pre-visit preparation completed: Yes  Pain : No/denies pain     Nutritional Status: BMI 25 -29 Overweight Nutritional Risks: None Diabetes: No  No results found for: HGBA1C   How often do you need to have someone help you when you read instructions, pamphlets, or other written materials from your doctor or pharmacy?: 1 - Never  Interpreter Needed?: No  Information entered by :: NAllen LPN   Activities of Daily Living     08/11/2024    8:31 AM  In your present state of health, do you have any difficulty performing the  following activities:  Hearing? 0  Vision? 0  Difficulty concentrating or making decisions? 0  Walking or climbing stairs? 0  Dressing or bathing? 0  Doing errands, shopping? 0  Preparing Food and eating ? N  Using the Toilet? N  In the past six months, have you accidently leaked urine? N  Do you have problems with loss of bowel control? N  Managing your Medications? N  Managing your Finances? N  Housekeeping or managing your Housekeeping? N    Patient Care Team: Early, Camie BRAVO, NP as PCP - General (Nurse Practitioner) [place], [facility]  I have updated your Care Teams any recent Medical Services you may have received from other providers in the past year.     Assessment:   This is a routine wellness examination for Lindsay Cook.  Hearing/Vision screen Hearing Screening - Comments:: Denies hearing issues Vision Screening - Comments:: Regular eye exams, [facility]   Goals Addressed             This Visit's Progress    Patient Stated       08/11/2024, stay healthy       Depression Screen     08/11/2024    8:37 AM 07/30/2023    8:50 AM 07/06/2022    9:02 AM 02/21/2021   10:36 AM 05/31/2020   12:36 PM 04/29/2018    4:00 PM  PHQ 2/9 Scores  PHQ - 2 Score 0 0 0 0 0 0  PHQ- 9 Score 0 0  0       Fall Risk     08/11/2024    8:36 AM 08/07/2023    2:18 PM 07/30/2023    8:49 AM 07/06/2022    9:01 AM 02/21/2021   10:36 AM  Fall Risk   Falls in the past year? 0 0 0 1 0  Comment    tripped over rug   Number falls in past yr: 0 0 0 0 0  Injury with Fall? 0 0 0 0 0  Risk for fall due to : No Fall Risks No Fall Risks Medication side effect No Fall Risks No Fall Risks  Follow up Falls evaluation completed;Falls prevention discussed Falls evaluation completed Falls prevention discussed;Falls evaluation completed Falls prevention discussed;Education provided;Falls evaluation completed  Falls evaluation completed      Data saved with a previous flowsheet row definition    MEDICARE RISK AT HOME:  Medicare Risk at Home Any stairs in or around the home?: Yes If so, are there any without handrails?: No Home free of loose throw rugs in walkways, pet beds, electrical cords, etc?: Yes Adequate lighting in your home to reduce risk of falls?: Yes Life alert?: No Use of a cane, walker or w/c?: No Grab bars in the bathroom?: No Shower chair or bench in shower?: Yes Elevated toilet seat or a handicapped toilet?: Yes  TIMED UP AND GO:  Was the test performed?  No  Cognitive Function: 6CIT completed        08/11/2024    8:38 AM 07/30/2023    8:51 AM 07/06/2022    9:04 AM  6CIT Screen  What Year? 0 points 0 points 0 points  What month? 0 points 0 points 0 points  What time? 0 points 0 points 0 points  Count back from 20 0 points 0 points 0 points  Months in reverse 0 points 0 points 0 points  Repeat phrase 0 points 2 points 2 points  Total Score 0 points 2  points 2 points    Immunizations Immunization History  Administered Date(s) Administered   Fluad Quad(high Dose 65+) 08/18/2020, 08/17/2021, 07/31/2022   INFLUENZA, HIGH DOSE SEASONAL PF 07/25/2016, 08/08/2017, 08/14/2018   Influenza,inj,Quad PF,6+ Mos 09/20/2014   Influenza-Unspecified 09/15/2013   PFIZER Comirnaty(Gray  Top)Covid-19 Tri-Sucrose Vaccine 05/22/2021   PFIZER(Purple Top)SARS-COV-2 Vaccination 12/10/2019, 01/04/2020, 07/06/2020   Pfizer Covid-19 Vaccine Bivalent Booster 25yrs & up 09/06/2021   Pfizer(Comirnaty)Fall Seasonal Vaccine 12 years and older 08/27/2022, 08/07/2023   Pneumococcal Conjugate-13 09/20/2014   Pneumococcal Polysaccharide-23 01/03/2015    Screening Tests Health Maintenance  Topic Date Due   DTaP/Tdap/Td (1 - Tdap) Never done   Zoster Vaccines- Shingrix (1 of 2) Never done   Influenza Vaccine  06/05/2024   COVID-19 Vaccine (8 - Pfizer risk 2024-25 season) 07/06/2024   Mammogram  08/20/2024   Medicare Annual Wellness (AWV)  08/11/2025   Colonoscopy  09/20/2025   Pneumococcal Vaccine: 50+ Years  Completed   DEXA SCAN  Completed   Hepatitis C Screening  Completed   Meningococcal B Vaccine  Aged Out    Health Maintenance Items Addressed: Vaccines Due: flu, TDAP and covid. Declines shingles vaccine  Additional Screening:  Vision Screening: Recommended annual ophthalmology exams for early detection of glaucoma and other disorders of the eye. Is the patient up to date with their annual eye exam?  Yes  Who is the provider or what is the name of the office in which the patient attends annual eye exams? [facility]  Dental Screening: Recommended annual dental exams for proper oral hygiene  Community Resource Referral / Chronic Care Management: CRR required this visit?  No   CCM required this visit?  No   Plan:    I have personally reviewed and noted the following in the patient's chart:   Medical and social history Use of alcohol, tobacco or illicit drugs  Current medications and supplements including opioid prescriptions. Patient is not currently taking opioid prescriptions. Functional ability and status Nutritional status Physical activity Advanced directives List of other physicians Hospitalizations, surgeries, and ER visits in previous 12  months Vitals Screenings to include cognitive, depression, and falls Referrals and appointments  In addition, I have reviewed and discussed with patient certain preventive protocols, quality metrics, and best practice recommendations. A written personalized care plan for preventive services as well as general preventive health recommendations were provided to patient.   Ardella FORBES Dawn, LPN   [id number]/12/[id number]   After Visit Summary: (MyChart) Due to this being a telephonic visit, the after visit summary with patients personalized plan was offered to patient via MyChart   Notes: Nothing significant to report at this time.

## 2024-08-12 ENCOUNTER — Other Ambulatory Visit (INDEPENDENT_AMBULATORY_CARE_PROVIDER_SITE_OTHER)

## 2024-08-12 DIAGNOSIS — D519 Vitamin B12 deficiency anemia, unspecified: Secondary | ICD-10-CM

## 2024-08-12 MED ORDER — CYANOCOBALAMIN 1000 MCG/ML IJ SOLN
1000.0000 ug | Freq: Once | INTRAMUSCULAR | Status: AC
  Administered 2024-08-12: 1000 ug via INTRAMUSCULAR

## 2024-08-19 ENCOUNTER — Other Ambulatory Visit (INDEPENDENT_AMBULATORY_CARE_PROVIDER_SITE_OTHER)

## 2024-08-19 DIAGNOSIS — D519 Vitamin B12 deficiency anemia, unspecified: Secondary | ICD-10-CM

## 2024-08-19 DIAGNOSIS — Z23 Encounter for immunization: Secondary | ICD-10-CM | POA: Diagnosis not present

## 2024-08-19 MED ORDER — CYANOCOBALAMIN 1000 MCG/ML IJ SOLN
1000.0000 ug | Freq: Once | INTRAMUSCULAR | Status: AC
  Administered 2024-08-19: 1000 ug via INTRAMUSCULAR

## 2024-08-24 ENCOUNTER — Other Ambulatory Visit

## 2024-08-25 ENCOUNTER — Other Ambulatory Visit (INDEPENDENT_AMBULATORY_CARE_PROVIDER_SITE_OTHER)

## 2024-08-25 ENCOUNTER — Ambulatory Visit
Admission: RE | Admit: 2024-08-25 | Discharge: 2024-08-25 | Disposition: A | Discharge status: Home / Self Care | Source: Ambulatory Visit | Attending: Nurse Practitioner | Admitting: Nurse Practitioner

## 2024-08-25 DIAGNOSIS — Z1231 Encounter for screening mammogram for malignant neoplasm of breast: Secondary | ICD-10-CM | POA: Diagnosis not present

## 2024-08-25 DIAGNOSIS — E538 Deficiency of other specified B group vitamins: Secondary | ICD-10-CM

## 2024-08-25 MED ORDER — CYANOCOBALAMIN 1000 MCG/ML IJ SOLN
1000.0000 ug | Freq: Once | INTRAMUSCULAR | Status: AC
  Administered 2024-08-25: 1000 ug via INTRAMUSCULAR

## 2024-08-28 ENCOUNTER — Ambulatory Visit: Payer: Self-pay | Admitting: Nurse Practitioner

## 2024-09-04 ENCOUNTER — Other Ambulatory Visit (INDEPENDENT_AMBULATORY_CARE_PROVIDER_SITE_OTHER)

## 2024-09-04 DIAGNOSIS — D519 Vitamin B12 deficiency anemia, unspecified: Secondary | ICD-10-CM

## 2024-09-04 MED ORDER — CYANOCOBALAMIN 1000 MCG/ML IJ SOLN
1000.0000 ug | Freq: Once | INTRAMUSCULAR | Status: AC
  Administered 2024-09-04: 1000 ug via INTRAMUSCULAR

## 2024-09-09 ENCOUNTER — Other Ambulatory Visit (INDEPENDENT_AMBULATORY_CARE_PROVIDER_SITE_OTHER): Payer: Self-pay

## 2024-09-09 DIAGNOSIS — D519 Vitamin B12 deficiency anemia, unspecified: Secondary | ICD-10-CM | POA: Diagnosis not present

## 2024-09-09 MED ORDER — CYANOCOBALAMIN 1000 MCG/ML IJ SOLN
1000.0000 ug | Freq: Once | INTRAMUSCULAR | Status: AC
  Administered 2024-09-09: 1000 ug via INTRAMUSCULAR

## 2024-09-10 DIAGNOSIS — D518 Other vitamin B12 deficiency anemias: Secondary | ICD-10-CM | POA: Diagnosis not present

## 2024-09-10 DIAGNOSIS — D6489 Other specified anemias: Secondary | ICD-10-CM | POA: Diagnosis not present

## 2024-09-10 DIAGNOSIS — C8800 Waldenstrom macroglobulinemia not having achieved remission: Secondary | ICD-10-CM | POA: Diagnosis not present

## 2024-09-16 ENCOUNTER — Other Ambulatory Visit

## 2024-09-16 ENCOUNTER — Ambulatory Visit

## 2024-09-23 ENCOUNTER — Other Ambulatory Visit

## 2024-09-29 ENCOUNTER — Other Ambulatory Visit (INDEPENDENT_AMBULATORY_CARE_PROVIDER_SITE_OTHER)

## 2024-09-29 DIAGNOSIS — Z23 Encounter for immunization: Secondary | ICD-10-CM

## 2024-09-29 NOTE — Progress Notes (Unsigned)
 Patient is in office today for a nurse visit for Immunization. Patient Injection was given in the  Right deltoid. Patient tolerated injection well.

## 2024-10-07 ENCOUNTER — Other Ambulatory Visit (INDEPENDENT_AMBULATORY_CARE_PROVIDER_SITE_OTHER)

## 2024-10-07 DIAGNOSIS — E538 Deficiency of other specified B group vitamins: Secondary | ICD-10-CM

## 2024-10-07 MED ORDER — CYANOCOBALAMIN 1000 MCG/ML IJ SOLN
1000.0000 ug | Freq: Once | INTRAMUSCULAR | Status: AC
  Administered 2024-10-07: 1000 ug via INTRAMUSCULAR

## 2024-11-03 ENCOUNTER — Other Ambulatory Visit

## 2024-11-04 ENCOUNTER — Other Ambulatory Visit

## 2024-11-04 DIAGNOSIS — E538 Deficiency of other specified B group vitamins: Secondary | ICD-10-CM | POA: Diagnosis not present

## 2024-11-04 MED ORDER — CYANOCOBALAMIN 1000 MCG/ML IJ SOLN
1000.0000 ug | Freq: Once | INTRAMUSCULAR | Status: AC
  Administered 2024-11-04: 1000 ug via INTRAMUSCULAR

## 2024-11-12 NOTE — Progress Notes (Unsigned)
 Tetanus (TDAP) Vaccine Pt declined. and Shingles (Shingrx) Vaccine Pt declined.  Lindsay Doing, DNP, AGNP-c [facility] [address] [place], [place] [address] Main Office [phone] VISIT TYPE: CPE on 11/13/2024 Today's Vitals   11/13/24 0932  BP: 128/72  Pulse: 78  Weight: 169 lb 9.6 oz (76.9 kg)  Height: 5' 2.5 (1.588 m)   Body mass index is 30.53 kg/m.  Wt Readings from Last 3 Encounters:  11/13/24 169 lb 9.6 oz (76.9 kg)  08/11/24 168 lb (76.2 kg)  08/07/23 169 lb (76.7 kg)     Subjective:  Annual Exam (CPE not fasting already had labs done, )  History of Present Illness Lindsay Cook is a 74 year old female with Odie macroglobulinemia who presents for a physical exam. This is the first time I have met Lindsay Cook.   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 Lindsay Cook has been managing her condition with a 'slow and steady' approach, consulting with specialists in [place] and [place].  Lindsay Cook has been receiving B12 injections due to low levels, which initially increased from 150 to 1500. Lindsay Cook had injections on December 1st and December 31st, with a blood draw on January 6th, but the latest B12 results are not yet available. Lindsay Cook is unsure if Lindsay Cook needs to continue the injections. These are managed by her oncologist. Lindsay Cook receives the injections in our office for convenience of location.   Her calcium levels have been fluctuating, with a recent reading of 10.7, which is slightly above normal.  Lindsay Cook is experiencing anemia, with hemoglobin and red blood cells decreasing since November. Her lymphocytes are elevated at 5.15, while her platelets have increased from 105 to 128.  Lindsay Cook has not started the Abrysvo injection yet due to insurance and pharmacy issues. Lindsay Cook is concerned about the cost and coverage of her medications, including a potential switch to a specialty pharmacy for  her prescriptions. Her oncologist is managing this prescription and treatment.   Lindsay Cook bruises easily, especially after blood draws and injections, but has no other significant changes in her skin.   Lindsay Cook has a small hemorrhoid but no changes in bowel or bladder habits.  Lindsay Cook has gained 30 pounds since the start of COVID-19, attributing it to changes in her diet and lifestyle during the pandemic. Lindsay Cook is content with her current weight and not actively trying to lose it.  Lindsay Cook has a history of poison ivy, which Lindsay Cook typically manages with triamcinolone  cream. Lindsay Cook has not had an outbreak in the past year but is prepared with protective measures when working outdoors.  Lindsay Cook is cautious about attending large gatherings due to her compromised immune system, especially during flu season. Lindsay Cook is considering not attending a family funeral due to the risk of infection. No shortness of breath, chest pain, dizziness, changes in hearing or vision, difficulty swallowing, weakness, numbness, tingling, changes in bowel or bladder habits, or vaginal bleeding.  Pertinent items are noted in HPI.     11/13/2024    9:32 AM 08/11/2024    8:37 AM 07/30/2023    8:50 AM 07/06/2022    9:02 AM 02/21/2021   10:36 AM  Depression screen PHQ 2/9  Decreased Interest 0 0 0 0 0  Down, Depressed, Hopeless 0 0 0 0 0  PHQ - 2 Score 0 0 0 0 0  Altered sleeping  0 0 0   Tired, decreased energy  0 0 0  Change in appetite  0 0 0   Feeling bad or failure about yourself   0 0 0   Trouble concentrating  0 0 0   Moving slowly or fidgety/restless  0 0 0   Suicidal thoughts  0 0 0   PHQ-9 Score  0  0  0    Difficult Cook work/chores  Not difficult at all Not difficult at all Not difficult at all      Data saved with a previous flowsheet row definition        No data to display             11/13/2024    9:32 AM 08/11/2024    8:36 AM 08/07/2023    2:18 PM 07/30/2023    8:49 AM 07/06/2022    9:01 AM  Fall Risk   Falls in the past  year? 0 0 0 0 1  Comment     tripped over rug  Number falls in past yr: 0 0 0 0 0  Injury with Fall? 0 0  0  0  0   Risk for fall due to : No Fall Risks No Fall Risks No Fall Risks Medication side effect No Fall Risks  Follow up Falls evaluation completed Falls evaluation completed;Falls prevention discussed Falls evaluation completed Falls prevention discussed;Falls evaluation completed Falls prevention discussed;Education provided;Falls evaluation completed      Data saved with a previous flowsheet row definition   Past medical history, surgical history, medications, allergies, family history and social history reviewed with patient today and changes made to appropriate areas of the chart.      Objective:    Physical Exam Vitals and nursing note reviewed.  Constitutional:      General: Lindsay Cook is not in acute distress.    Appearance: Normal appearance. Lindsay Cook is not ill-appearing.  HENT:     Head: Normocephalic and atraumatic.     Right Ear: Hearing, tympanic membrane, ear canal and external ear normal.     Left Ear: Hearing, tympanic membrane, ear canal and external ear normal.     Nose:     Right Sinus: No maxillary sinus tenderness or frontal sinus tenderness.     Left Sinus: No maxillary sinus tenderness or frontal sinus tenderness.     Mouth/Throat:     Lips: Pink.  Eyes:     General: Lids are normal. Vision grossly intact.     Extraocular Movements: Extraocular movements intact.     Conjunctiva/sclera: Conjunctivae normal.     Pupils: Pupils are equal, round, and reactive to light.     Funduscopic exam:    Right eye: Red reflex present.        Left eye: Red reflex present.    Visual Fields: Right eye visual fields normal and left eye visual fields normal.  Neck:     Thyroid: No thyromegaly.     Vascular: No carotid bruit.  Cardiovascular:     Rate and Rhythm: Normal rate and regular rhythm.     Chest Wall: PMI is not displaced.     Pulses: Normal pulses.          Dorsalis  pedis pulses are 2+ on the right side and 2+ on the left side.       Posterior tibial pulses are 2+ on the right side and 2+ on the left side.     Heart sounds: Normal heart sounds. No murmur heard. Pulmonary:     Effort: Pulmonary effort is  normal. No respiratory distress.     Breath sounds: Normal breath sounds.  Abdominal:     General: Abdomen is flat. Bowel sounds are normal. There is no distension.     Palpations: Abdomen is soft. There is no hepatomegaly, splenomegaly or mass.     Tenderness: There is no abdominal tenderness. There is no right CVA tenderness, left CVA tenderness, guarding or rebound.  Musculoskeletal:        General: Normal range of motion.     Cervical back: Full passive range of motion without pain, normal range of motion and neck supple. No tenderness.     Right lower leg: No edema.     Left lower leg: No edema.  Feet:     Left foot:     Toenail Condition: Left toenails are normal.  Lymphadenopathy:     Cervical: No cervical adenopathy.     Upper Body:     Right upper body: No supraclavicular adenopathy.     Left upper body: No supraclavicular adenopathy.  Skin:    General: Skin is warm and dry.     Capillary Refill: Capillary refill takes less than 2 seconds.     Nails: There is no clubbing.  Neurological:     General: No focal deficit present.     Mental Status: Lindsay Cook is alert and oriented to person, place, and time.     GCS: GCS eye subscore is 4. GCS verbal subscore is 5. GCS motor subscore is 6.     Sensory: Sensation is intact.     Motor: Motor function is intact.     Coordination: Coordination is intact.     Gait: Gait is intact.     Deep Tendon Reflexes: Reflexes are normal and symmetric.  Psychiatric:        Attention and Perception: Attention normal.        Mood and Affect: Mood normal.        Speech: Speech normal.        Behavior: Behavior is cooperative.        Cognition and Memory: Cognition and memory normal.         Assessment &  Plan:   Assessment & Plan Encounter for annual physical exam CPE completed today. Review of HM activities and recommendations discussed and provided on AVS. Anticipatory guidance, diet, and exercise recommendations provided. Medications, allergies, and hx reviewed and updated as necessary. Orders placed as listed below.  Plan: - Labs reviewed from oncology. Patient declines statin monitoring.  - F/U with CPE in 1 year or sooner for acute/chronic health needs as directed.      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 Hemoglobin and red blood cells are decreasing, and lymphocytes are elevated. Platelets have increased from 105 to 128. Calcium levels are slightly elevated but not concerning. Lindsay Cook is preparing to start oral chemotherapy next week. Concerns about chemotherapy exposure to children and toilet hygiene were discussed with recommendations to speak to oncologist about specific precautions given the nature of the medication.  - Continue follow-up with oncologist at [facility]. - Start oral chemotherapy as planned.    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 Lindsay Cook uses protective measures to prevent exposure. Lindsay Cook is currently out of medication - Prescribed triamcinolone  cream for future outbreaks. Orders:   triamcinolone  cream (KENALOG ) 0.1 %; Apply sparingly to the affected area of skin twice daily as needed.  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 B12 injections have not improved anemia. - Continue monitoring hemoglobin and red blood cell counts. - Will discuss B12 levels with oncologist to determine necessity of continued injections.    Thrombocytopenia Thrombocytopenia with recent  improvement in platelet count from 105 to 128. Platelet levels can fluctuate significantly. Managed by oncology. No signs of illness at this time. Discussed protection with masking.  - Continue monitoring platelet counts.    High serum vitamin B12 Previously very low levels, managed with monthly injections. Recent B12 levels showed significant elevations without improvement of anemia, suggesting that this is not the cause. Oncologist will evaluate the necessity of continued B12 injections. OK to continue with B12 injections with this office for convenience. Will need to monitor oncology orders and labs.  - Continue monthly B12 injections as scheduled. - Will discuss B12 levels with oncologist to determine necessity of continued injections.    NEXT PREVENTATIVE PHYSICAL DUE IN 1 YEAR.    PATIENT COUNSELING PROVIDED FOR ALL ADULT PATIENTS: A well balanced diet low in saturated fats, cholesterol, and moderation in carbohydrates.  This can be as simple as monitoring portion sizes and cutting back on sugary beverages such as soda and juice to start with.    Daily water consumption of at least 64 ounces.  Physical activity at least 180 minutes per week.  If just starting out, start 10 minutes a day and work your way up.   This can be as simple as taking the stairs instead of the elevator and walking 2-3 laps around the office  purposefully every day.   STD protection, partner selection, and regular testing if high risk.  Limited consumption of alcoholic beverages if alcohol is consumed. For men, I recommend no more than 14 alcoholic beverages per week, spread out throughout the week (max 2 per day). Avoid binge drinking or consuming large quantities of alcohol in one setting.  Please let me know if you feel you may need help with reduction or quitting alcohol consumption.   Avoidance of nicotine, if used. Please let me know if you feel you may need help with reduction or quitting nicotine  use.   Daily mental health attention. This can be in the form of 5 minute daily meditation, prayer, journaling, yoga, reflection, etc.  Purposeful attention to your emotions and mental state can significantly improve your overall wellbeing  and Health.  Please know that I am here to help you with all of your health care goals and am happy to work with you to find a solution that works best for you.  The greatest advice I have received with any changes in life are to take it one step at a time, that even means if all you can focus on is the next 60 seconds, then do that and celebrate your victories.  With any changes in life, you will have set backs, and that is OK. The important thing to remember is, if you have a set back, it is not a failure, it is an opportunity to try again! Screening Testing Mammogram Every 1 -2 years based on history and risk factors Starting at age 85 Pap Smear Ages 21-39 every 3 years Ages 59-65 every 5 years with HPV testing More frequent testing may be required based on results and history Colon Cancer Screening Every 1-10 years based on test performed, risk  factors, and history Starting at age 23 Bone Density Screening Every 2-10 years based on history Starting at age 76 for women Recommendations for men differ based on medication usage, history, and risk factors AAA Screening One time ultrasound Men 54-49 years old who have every smoked Lung Cancer Screening Low Dose Lung CT every 12 months Age 4-80 years with a 30 pack-year smoking history who still smoke or who have quit within the last 15 years   Screening Labs Routine  Labs: Complete Blood Count (CBC), Complete Metabolic Panel (CMP), Cholesterol (Lipid Panel) Every 6-12 months based on history and medications May be recommended more frequently based on current conditions or previous results Hemoglobin A1c Lab Every 3-12 months based on history and previous results Starting at age 21 or earlier  with diagnosis of diabetes, high cholesterol, BMI >26, and/or risk factors Frequent monitoring for patients with diabetes to ensure blood sugar control Thyroid Panel (TSH) Every 6 months based on history, symptoms, and risk factors May be repeated more often if on medication HIV One time testing for all patients 1 and older May be repeated more frequently for patients with increased risk factors or exposure Hepatitis C One time testing for all patients 17 and older May be repeated more frequently for patients with increased risk factors or exposure Gonorrhea, Chlamydia Every 12 months for all sexually active persons 13-24 years Additional monitoring may be recommended for those who are considered high risk or who have symptoms Every 12 months for any woman on birth control, regardless of sexual activity PSA Men 25-11 years old with risk factors Additional screening may be recommended from age 31-69 based on risk factors, symptoms, and history  Vaccine Recommendations Tetanus Booster All adults every 10 years Flu Vaccine All patients 6 months and older every year COVID Vaccine All patients 12 years and older Initial dosing with booster May recommend additional booster based on age and health history HPV Vaccine 2 doses all patients age 31-26 Dosing may be considered for patients over 26 Shingles Vaccine (Shingrix) 2 doses all adults 55 years and older Pneumonia (Pneumovax 27) All adults 65 years and older May recommend earlier dosing based on health history One year apart from Prevnar 22 Pneumonia (Prevnar 16) All adults 65 years and older Dosed 1 year after Pneumovax 23 Pneumonia (Prevnar 20) One time alternative to the two dosing of 13 and 23 For all adults with initial dose of 23, 20 is recommended 1 year later For all adults with initial dose of 13, 23 is still recommended as second option 1 year later

## 2024-11-13 ENCOUNTER — Ambulatory Visit: Payer: Self-pay | Admitting: Nurse Practitioner

## 2024-11-13 ENCOUNTER — Encounter: Payer: Self-pay | Admitting: Nurse Practitioner

## 2024-11-13 VITALS — BP 128/72 | HR 78 | Ht 62.5 in | Wt 169.6 lb

## 2024-11-13 DIAGNOSIS — D630 Anemia in neoplastic disease: Secondary | ICD-10-CM

## 2024-11-13 DIAGNOSIS — D696 Thrombocytopenia, unspecified: Secondary | ICD-10-CM | POA: Diagnosis not present

## 2024-11-13 DIAGNOSIS — L237 Allergic contact dermatitis due to plants, except food: Secondary | ICD-10-CM | POA: Diagnosis not present

## 2024-11-13 DIAGNOSIS — R7989 Other specified abnormal findings of blood chemistry: Secondary | ICD-10-CM | POA: Diagnosis not present

## 2024-11-13 DIAGNOSIS — Z0000 Encounter for general adult medical examination without abnormal findings: Secondary | ICD-10-CM

## 2024-11-13 DIAGNOSIS — C8800 Waldenstrom macroglobulinemia not having achieved remission: Secondary | ICD-10-CM | POA: Diagnosis not present

## 2024-11-13 MED ORDER — TRIAMCINOLONE ACETONIDE 0.1 % EX CREA: TOPICAL_CREAM | CUTANEOUS | 0 refills | Status: AC

## 2024-11-13 NOTE — Patient Instructions (Addendum)
 It was a pleasure to meet you today.   Everything looks good today. I will keep my eye out for the notes to come in from the Oncologist.   If there are any acute concerns, please let me know.     For all adult patients, I recommend A well balanced diet low in saturated fats, cholesterol, and moderation in carbohydrates.   This can be as simple as monitoring portion sizes and cutting back on sugary beverages such as soda and juice to start with.    Daily water consumption of at least 64 ounces.  Physical activity at least 180 minutes per week, if just starting out.   This can be as simple as taking the stairs instead of the elevator and walking 2-3 laps around the office  purposefully every day.   STD protection, partner selection, and regular testing if high risk.  Limited consumption of alcoholic beverages if alcohol is consumed.  For women, I recommend no more than 7 alcoholic beverages per week, spread out throughout the week.  Avoid binge drinking or consuming large quantities of alcohol in one setting.   Please let me know if you feel you may need help with reduction or quitting alcohol consumption.   Avoidance of nicotine, if used.  Please let me know if you feel you may need help with reduction or quitting nicotine use.   Daily mental health attention.  This can be in the form of 5 minute daily meditation, prayer, journaling, yoga, reflection, etc.   Purposeful attention to your emotions and mental state can significantly improve your overall wellbeing  and  Health.  Please know that I am here to help you with all of your health care goals and am happy to work with you to find a solution that works best for you.  The greatest advice I have received with any changes in life are to take it one step at a time, that even means if all you can focus on is the next 60 seconds, then do that and celebrate your victories.  With any changes in life, you will have set backs, and that is  OK. The important thing to remember is, if you have a set back, it is not a failure, it is an opportunity to try again!  Health Maintenance Recommendations Screening Testing Mammogram Every 1 -2 years based on history and risk factors Starting at age 1 Pap Smear Ages 21-39 every 3 years Ages 32-65 every 5 years with HPV testing More frequent testing may be required based on results and history Colon Cancer Screening Every 1-10 years based on test performed, risk factors, and history Starting at age 34 Bone Density Screening Every 2-10 years based on history Starting at age 55 for women Recommendations for men differ based on medication usage, history, and risk factors AAA Screening One time ultrasound Men 38-96 years old who have every smoked Lung Cancer Screening Low Dose Lung CT every 12 months Age 17-80 years with a 30 pack-year smoking history who still smoke or who have quit within the last 15 years  Screening Labs Routine  Labs: Complete Blood Count (CBC), Complete Metabolic Panel (CMP), Cholesterol (Lipid Panel) Every 6-12 months based on history and medications May be recommended more frequently based on current conditions or previous results Hemoglobin A1c Lab Every 3-12 months based on history and previous results Starting at age 2 or earlier with diagnosis of diabetes, high cholesterol, BMI >26, and/or risk factors Frequent monitoring for patients  with diabetes to ensure blood sugar control Thyroid Panel (TSH w/ T3 & T4) Every 6 months based on history, symptoms, and risk factors May be repeated more often if on medication HIV One time testing for all patients 8 and older May be repeated more frequently for patients with increased risk factors or exposure Hepatitis C One time testing for all patients 57 and older May be repeated more frequently for patients with increased risk factors or exposure Gonorrhea, Chlamydia Every 12 months for all sexually active  persons 13-24 years Additional monitoring may be recommended for those who are considered high risk or who have symptoms PSA Men 72-83 years old with risk factors Additional screening may be recommended from age 50-69 based on risk factors, symptoms, and history  Vaccine Recommendations Tetanus Booster All adults every 10 years Flu Vaccine All patients 6 months and older every year COVID Vaccine All patients 12 years and older Initial dosing with booster May recommend additional booster based on age and health history HPV Vaccine 2 doses all patients age 28-26 Dosing may be considered for patients over 26 Shingles Vaccine (Shingrix) 2 doses all adults 55 years and older Pneumonia (Pneumovax 23) All adults 65 years and older May recommend earlier dosing based on health history Pneumonia (Prevnar 64) All adults 65 years and older Dosed 1 year after Pneumovax 23  Additional Screening, Testing, and Vaccinations may be recommended on an individualized basis based on family history, health history, risk factors, and/or exposure.

## 2024-11-13 NOTE — Assessment & Plan Note (Signed)
 SABRA

## 2024-11-16 ENCOUNTER — Encounter: Payer: Self-pay | Admitting: Nurse Practitioner

## 2024-11-16 DIAGNOSIS — D630 Anemia in neoplastic disease: Secondary | ICD-10-CM | POA: Insufficient documentation

## 2024-11-16 DIAGNOSIS — D696 Thrombocytopenia, unspecified: Secondary | ICD-10-CM | POA: Insufficient documentation

## 2024-11-16 NOTE — Assessment & Plan Note (Signed)
 Anemia likely secondary to Waldenstrom macroglobulinemia, with decreasing hemoglobin and red blood cell counts. B12 injections have not improved anemia. - Continue monitoring hemoglobin and red blood cell counts. - Will discuss B12 levels with oncologist to determine necessity of continued injections.

## 2024-11-16 NOTE — Assessment & Plan Note (Signed)
 Thrombocytopenia with recent improvement in platelet count from 105 to 128. Platelet levels can fluctuate significantly. Managed by oncology. No signs of illness at this time. Discussed protection with masking.  - Continue monitoring platelet counts.

## 2024-11-17 ENCOUNTER — Other Ambulatory Visit: Payer: Self-pay

## 2024-12-02 ENCOUNTER — Other Ambulatory Visit

## 2024-12-04 ENCOUNTER — Other Ambulatory Visit

## 2025-01-06 ENCOUNTER — Other Ambulatory Visit

## 2025-08-17 ENCOUNTER — Ambulatory Visit: Payer: Self-pay

## 2025-11-25 ENCOUNTER — Encounter: Admitting: Nurse Practitioner
